# Patient Record
Sex: Male | Born: 1960 | Race: Black or African American | Hispanic: No | Marital: Married | Smoking: Never smoker
Health system: Southern US, Community
[De-identification: ages and names within clinical notes are randomized; demographics above are authoritative.]

## PROBLEM LIST (undated history)

## (undated) DIAGNOSIS — N4400 Torsion of testis, unspecified: Secondary | ICD-10-CM

## (undated) DIAGNOSIS — E785 Hyperlipidemia, unspecified: Secondary | ICD-10-CM

## (undated) HISTORY — PX: KNEE SURGERY: SHX244

## (undated) HISTORY — DX: Torsion of testis, unspecified: N44.00

## (undated) HISTORY — PX: OTHER SURGICAL HISTORY: SHX169

## (undated) HISTORY — DX: Hyperlipidemia, unspecified: E78.5

---

## 2005-06-16 ENCOUNTER — Emergency Department (HOSPITAL_COMMUNITY): Admission: EM | Admit: 2005-06-16 | Discharge: 2005-06-16 | Payer: Self-pay | Admitting: Emergency Medicine

## 2007-06-21 ENCOUNTER — Emergency Department (HOSPITAL_COMMUNITY): Admission: EM | Admit: 2007-06-21 | Discharge: 2007-06-21 | Payer: Self-pay | Admitting: *Deleted

## 2008-08-14 ENCOUNTER — Emergency Department (HOSPITAL_COMMUNITY): Admission: EM | Admit: 2008-08-14 | Discharge: 2008-08-14 | Payer: Self-pay | Admitting: Family Medicine

## 2008-11-01 ENCOUNTER — Encounter: Admission: RE | Admit: 2008-11-01 | Discharge: 2008-11-01 | Payer: Self-pay | Admitting: Family Medicine

## 2009-11-10 ENCOUNTER — Emergency Department (HOSPITAL_COMMUNITY): Admission: EM | Admit: 2009-11-10 | Discharge: 2009-11-10 | Payer: Self-pay | Admitting: Emergency Medicine

## 2010-04-12 LAB — HM COLONOSCOPY

## 2011-07-02 LAB — CBC
Hemoglobin: 12.8 — ABNORMAL LOW
Platelets: 218
RBC: 4.67

## 2011-07-02 LAB — POCT I-STAT, CHEM 8
BUN: 13
Calcium, Ion: 1.21
Hemoglobin: 14.3
Potassium: 4
Sodium: 142

## 2011-07-02 LAB — DIFFERENTIAL
Lymphocytes Relative: 35
Monocytes Absolute: 0.5
Monocytes Relative: 11
Neutro Abs: 2.1

## 2011-07-02 LAB — POCT CARDIAC MARKERS
CKMB, poc: 3.8
CKMB, poc: 4.9
Myoglobin, poc: 123
Troponin i, poc: <0.05

## 2014-01-28 ENCOUNTER — Emergency Department (HOSPITAL_COMMUNITY): Payer: 59

## 2014-01-28 ENCOUNTER — Encounter (HOSPITAL_COMMUNITY): Payer: Self-pay | Admitting: Emergency Medicine

## 2014-01-28 ENCOUNTER — Emergency Department (HOSPITAL_COMMUNITY)
Admission: EM | Admit: 2014-01-28 | Discharge: 2014-01-28 | Disposition: A | Discharge status: Home / Self Care | Payer: 59 | Attending: Emergency Medicine | Admitting: Emergency Medicine

## 2014-01-28 DIAGNOSIS — X500XXA Overexertion from strenuous movement or load, initial encounter: Secondary | ICD-10-CM | POA: Insufficient documentation

## 2014-01-28 DIAGNOSIS — Y9239 Other specified sports and athletic area as the place of occurrence of the external cause: Secondary | ICD-10-CM | POA: Insufficient documentation

## 2014-01-28 DIAGNOSIS — S82009A Unspecified fracture of unspecified patella, initial encounter for closed fracture: Secondary | ICD-10-CM

## 2014-01-28 DIAGNOSIS — Y92838 Other recreation area as the place of occurrence of the external cause: Secondary | ICD-10-CM

## 2014-01-28 DIAGNOSIS — Z79899 Other long term (current) drug therapy: Secondary | ICD-10-CM | POA: Insufficient documentation

## 2014-01-28 DIAGNOSIS — Y9367 Activity, basketball: Secondary | ICD-10-CM | POA: Insufficient documentation

## 2014-01-28 MED ORDER — OXYCODONE-ACETAMINOPHEN 5-325 MG PO TABS: 1.0000 | ORAL_TABLET | Freq: Four times a day (QID) | ORAL | Status: DC | PRN

## 2014-01-28 NOTE — Discharge Instructions (Signed)
Patellar Fracture, Adult A patellar fracture is a break in your kneecap (patella).  CAUSES   A direct blow to the knee or a fall is usually the cause of a broken patella.  A very hard and strong bending of your knee can cause a patellar fracture. RISK FACTORS Involvement in contact sports, especially sports that involve a lot of jumping. SIGNS AND SYMPTOMS   Tender and swollen knee.  Pain when you move your knee, especially when you try to straighten out your leg.  Difficulty walking or putting weight on your knee.  Misshapen knee (as if a bone is out of place). DIAGNOSIS  Patellar fracture is usually diagnosed with a physical exam and an X-ray exam. TREATMENT  Treatment depends on the type of fracture:  If your patella is still in the right position after the fracture and you can still straighten your leg out, you can usually be treated with a splint or cast for 4 6 weeks.  If your patella is broken into multiple small pieces but you are able to straighten your leg, you can usually be treated with a splint or cast for 4 6 weeks. Sometimes your patella may need to be removed before the cast is applied.  If you cannot straighten out your leg after a patellar fracture, then surgery is required to hold the bony fragments together until they heal. A cast or splint will be applied for 4 6 weeks. HOME CARE INSTRUCTIONS   Only take over-the-counter or prescription medicines for pain, discomfort, or fever as directed by your health care provider.  Use crutches as directed, and exercise the leg as directed.  Apply ice to the injured area:  Put ice in a plastic bag.  Place a towel between your skin and the bag.  Leave the ice on for 20 minutes, 2 3 times a day.  Elevate the affected knee above the level of your heart. SEEK MEDICAL CARE IF:  You suspect you have significantly injured your knee.  You hear a pop after a knee injury.  Your knee is misshapen after a knee  injury.  You have pain when you move your knee.  You have difficulty walking or putting weight on your knee.  You cannot fully move your knee. SEEK IMMEDIATE MEDICAL CARE IF:  You have redness, swelling, or increasing pain in your knee.  You have a fever. Document Released: 06/15/2003 Document Revised: 07/07/2013 Document Reviewed: 04/28/2013 [facility] Patient Information 2014 [place].

## 2014-01-28 NOTE — ED Notes (Addendum)
Per EMS patient dislocated patella after landing wrong playing basketball at the gym. Patient states as long as he keeps his leg straight he feels okay, when he bends it the patella moves up towards his thigh.

## 2014-01-28 NOTE — ED Provider Notes (Signed)
CSN: [id number]     Arrival date & time 01/28/14  1222 History   First MD Initiated Contact with Patient 01/28/14 1230     Chief Complaint  Patient presents with  . Knee Injury     (Consider location/radiation/quality/duration/timing/severity/associated sxs/prior Treatment) The history is provided by the patient.   patient was playing basketball he felt a pop in his right knee he states he gave out for. He's had trouble walking with it since. There is swelling of any. No other injury.  History reviewed. No pertinent past medical history. No past surgical history on file. No family history on file. History  Substance Use Topics  . Smoking status: Never Smoker   . Smokeless tobacco: Not on file  . Alcohol Use: Yes     Comment: socially    Review of Systems  Constitutional: Negative for activity change and appetite change.  Respiratory: Negative for chest tightness and shortness of breath.   Genitourinary: Negative for flank pain.  Musculoskeletal: Negative for back pain.       Right knee pain and swelling  Neurological: Negative for weakness and numbness.      Allergies  Review of patient's allergies indicates not on file.  Home Medications   Prior to Admission medications   Medication Sig Start Date End Date Taking? Authorizing Provider  Multiple Vitamin (MULTIVITAMIN WITH MINERALS) TABS tablet Take 1 tablet by mouth daily.   Yes Historical Provider, MD  oxyCODONE-acetaminophen (PERCOCET/ROXICET) 5-325 MG per tablet Take 1-2 tablets by mouth every 6 (six) hours as needed for severe pain. 01/28/14   Jasper Riling. Hodan Wurtz, MD   BP 131/77  Pulse 58  Temp(Src) 98 F (36.7 C) (Oral)  Resp 18  SpO2 98% Physical Exam  Constitutional: He is oriented to person, place, and time. He appears well-developed.  Cardiovascular: Normal rate and regular rhythm.   Musculoskeletal: He exhibits tenderness.  Swelling to right knee. Palpable deformity and diastases of right patella. Patient  is not able to extend right knee. Sensation and pulses intact over right foot. No tenderness over right hip. Skin is intact  Neurological: He is alert and oriented to person, place, and time.  Skin: Skin is warm.    ED Course  Procedures (including critical care time) Labs Review Labs Reviewed - No data to display  Imaging Review Dg Knee Complete 4 Views Right  01/28/2014   CLINICAL DATA:  Knee injury.  EXAM: RIGHT KNEE - COMPLETE 4+ VIEW  COMPARISON:  None.  FINDINGS: Fracture of the inferior lateral aspect of the patella is noted, age undetermined. Femur and tibia intact. No joint effusion.  IMPRESSION: 1. Fracture of the inferior lateral aspect of the patella, age undetermined. This fracture may be old.  2. No acute abnormality otherwise noted. No evidence of knee joint effusion.   Electronically Signed   By: Marcello Moores  Register   On: 01/28/2014 13:32     EKG Interpretation None      MDM   Final diagnoses:  Patella fracture    Patient with right inferior patella fracture. Immobilizer and will followup with orthopedic surgery. He is previously seen Raliegh Ip, and will followup with them    Jasper Riling. Alvino Chapel, [place] 01/28/14 [phone]

## 2014-01-28 NOTE — ED Notes (Signed)
Bed: WA06 Expected date:  Expected time:  Means of arrival:  Comments: EMS-knee dislocation

## 2014-11-06 ENCOUNTER — Ambulatory Visit (INDEPENDENT_AMBULATORY_CARE_PROVIDER_SITE_OTHER): Payer: 59 | Admitting: Family Medicine

## 2014-11-06 VITALS — BP 120/74 | HR 83 | Temp 98.9°F | Resp 20 | Ht 73.0 in | Wt 263.2 lb

## 2014-11-06 DIAGNOSIS — J0110 Acute frontal sinusitis, unspecified: Secondary | ICD-10-CM

## 2014-11-06 DIAGNOSIS — R112 Nausea with vomiting, unspecified: Secondary | ICD-10-CM

## 2014-11-06 DIAGNOSIS — R6889 Other general symptoms and signs: Secondary | ICD-10-CM

## 2014-11-06 DIAGNOSIS — L259 Unspecified contact dermatitis, unspecified cause: Secondary | ICD-10-CM

## 2014-11-06 LAB — POCT INFLUENZA A/B
Influenza A, POC: NEGATIVE
Influenza B, POC: NEGATIVE

## 2014-11-06 MED ORDER — OSELTAMIVIR PHOSPHATE 75 MG PO CAPS: 75.0000 mg | ORAL_CAPSULE | Freq: Two times a day (BID) | ORAL | Status: DC

## 2014-11-06 MED ORDER — TRIAMCINOLONE ACETONIDE 0.1 % EX CREA: 1.0000 "application " | TOPICAL_CREAM | Freq: Two times a day (BID) | CUTANEOUS | Status: DC

## 2014-11-06 NOTE — Progress Notes (Signed)
Chief Complaint:  Chief Complaint  Patient presents with  . Sinusitis  . Fatigue    HPI: Wesley King is a 54 y.o. male who is here for chills, fatigue and also sinus congestion;  went to work in this morning at [employer] at 2 am and started feeling like this, he has been working long hours 10-12 hours and 7 days a week and working out and he thinks that is why he is fatigued. He shows me his fitbit and he is drinking enough water . He has had the flu vaccine in Nov or December. Fatigue, chills. No fvers or sore throat, and he had some mucus stuck in throat. He has HAs but denies any facial pain, he states he feels congest for the last 1 week. He has msk aches but not sure if it is from working out.Denies fevers. He has a slight cough but not much, denies CP/SOB. He was supposed to work but "can't go no more" so came here tog et evaluated. No n/v/abd pain, increase thirst/urination. He has been drinking lots of orange juice  Ha sahd a rash on his left knee, had knee surgery in March and wound itself healed well. He had an area where it blistered not near the wound site and he put hydrocortisone cream on it and the area turned dark black, he also has a rash thatis itchy on the right side of his ar, that has been there for several months. No swelling, redness, warmth at those areas c/w skin infection. He states it is better but is wondering why his skin turned a darker color  History reviewed. No pertinent past medical history. Past Surgical History  Procedure Laterality Date  . Knee surgery     History   Social History  . Marital Status: Married    Spouse Name: N/A    Number of Children: N/A  . Years of Education: N/A   Social History Main Topics  . Smoking status: Never Smoker   . Smokeless tobacco: Never Used  . Alcohol Use: 0.0 oz/week    0 Not specified per week     King: socially  . Drug Use: No  . Sexual Activity: None   Other Topics Concern  . None   Social  History Narrative   History reviewed. No pertinent family history. No Known Allergies Prior to Admission medications   Medication Sig Start Date End Date Taking? Authorizing Provider  Multiple Vitamin (MULTIVITAMIN WITH MINERALS) TABS tablet Take 1 tablet by mouth daily.   Yes Historical Provider, MD  oxyCODONE-acetaminophen (PERCOCET/ROXICET) 5-325 MG per tablet Take 1-2 tablets by mouth every 6 (six) hours as needed for severe pain. Patient not taking: Reported on 11/06/2014 01/28/14   Wesley King. Pickering, MD     ROS: The patient denies  night sweats, unintentional weight loss, chest pain, palpitations, wheezing, dyspnea on exertion, nausea, vomiting, abdominal pain, dysuria, hematuria, melena, numbness, or tingling.  All other systems have been reviewed and were otherwise negative with the exception of those mentioned in the HPI and as above.    PHYSICAL EXAM: Filed Vitals:   11/06/14 1149  BP: 120/74  Pulse: 83  Temp: 98.9 F (37.2 C)  Resp: 20   Filed Vitals:   11/06/14 1149  Height: 6\' 1"  (1.854 m)  Weight: 263 lb 4 oz (119.409 kg)   Body mass index is 34.74 kg/(m^2).  General: Alert, no acute distress HEENT:  Normocephalic, atraumatic, oropharynx patent. EOMI, PERRLA  Erythematous throat, no exudates, TM normal, neg sinus tenderness, + erythematous/boggy nasal mucosa Cardiovascular:  Regular rate and rhythm, no rubs murmurs or gallops.  No Carotid bruits, radial pulse intact. No pedal edema.  Respiratory: Clear to auscultation bilaterally.  No wheezes, rales, or rhonchi.  No cyanosis, no use of accessory musculature GI: No organomegaly, abdomen is soft and non-tender, positive bowel sounds.  No masses. Skin: + eczematous rashes.+ dark areas, no infection Neurologic: Facial musculature symmetric. Psychiatric: Patient is appropriate throughout our interaction. Lymphatic: No cervical lymphadenopathy Musculoskeletal: Gait intact.   LABS: Results for orders placed or  performed in visit on 11/06/14  POCT Influenza A/B  Result Value Ref Range   Influenza A, POC Negative    Influenza B, POC Negative      EKG/XRAY:   Primary read interpreted by Dr. Marin King at [facility].   ASSESSMENT/PLAN: Encounter Diagnoses  Name Primary?  . Acute frontal sinusitis, recurrence not specified Yes  . Flu-like symptoms   . Non-intractable vomiting with nausea, vomiting of unspecified type   . Contact dermatitis    RX tamiflu D/w patient negative results but will treat since feeling msk aches and fatigue all over--unable to get blood,tried multiple times, patient also threw up while in office, mostly water and orange juice Will give z pack prn if worsening sinus sxs  Rx triamcinolone to affected area on skin, precautions given Push fluids, fu prn, work note given  Gross sideeffects, risk and benefits, and alternatives of medications d/w patient. Patient is aware that all medications have potential sideeffects and we are unable to predict every sideeffect or drug-drug interaction that may occur.  Wesley King, [place], DO 11/06/2014 1:39 PM

## 2014-11-06 NOTE — Patient Instructions (Signed)
Influenza °Influenza ("the flu") is a viral infection of the respiratory tract. It occurs more often in winter months because people spend more time in close contact with one another. Influenza can make you feel very sick. Influenza easily spreads from person to person (contagious). °CAUSES  °Influenza is caused by a virus that infects the respiratory tract. You can catch the virus by breathing in droplets from an infected person's cough or sneeze. You can also catch the virus by touching something that was recently contaminated with the virus and then touching your mouth, nose, or eyes. °RISKS AND COMPLICATIONS °You may be at risk for a more severe case of influenza if you smoke cigarettes, have diabetes, have chronic heart disease (such as heart failure) or lung disease (such as asthma), or if you have a weakened immune system. Elderly people and pregnant women are also at risk for more serious infections. The most common problem of influenza is a lung infection (pneumonia). Sometimes, this problem can require emergency medical care and may be life threatening. °SIGNS AND SYMPTOMS  °Symptoms typically last 4 to 10 days and may include: °· Fever. °· Chills. °· Headache, body aches, and muscle aches. °· Sore throat. °· Chest discomfort and cough. °· Poor appetite. °· Weakness or feeling tired. °· Dizziness. °· Nausea or vomiting. °DIAGNOSIS  °Diagnosis of influenza is often made based on your history and a physical exam. A nose or throat swab test can be done to confirm the diagnosis. °TREATMENT  °In mild cases, influenza goes away on its own. Treatment is directed at relieving symptoms. For more severe cases, your health care provider may prescribe antiviral medicines to shorten the sickness. Antibiotic medicines are not effective because the infection is caused by a virus, not by bacteria. °HOME CARE INSTRUCTIONS °· Take medicines only as directed by your health care provider. °· Use a cool mist humidifier to make  breathing easier. °· Get plenty of rest until your temperature returns to normal. This usually takes 3 to 4 days. °· Drink enough fluid to keep your urine clear or pale yellow. °· Cover your mouth and nose when coughing or sneezing, and wash your hands well to prevent the virus from spreading. °· Stay home from work or school until the fever is gone for at least 1 full day. °PREVENTION  °An annual influenza vaccination (flu shot) is the best way to avoid getting influenza. An annual flu shot is now routinely recommended for all adults in the [place] °SEEK MEDICAL CARE IF: °· You experience chest pain, your cough worsens, or you produce more mucus. °· You have nausea, vomiting, or diarrhea. °· Your fever returns or gets worse. °SEEK IMMEDIATE MEDICAL CARE IF: °· You have trouble breathing, you become short of breath, or your skin or nails become bluish. °· You have severe pain or stiffness in the neck. °· You develop a sudden headache, or pain in the face or ear. °· You have nausea or vomiting that you cannot control. °MAKE SURE YOU:  °· Understand these instructions. °· Will watch your condition. °· Will get help right away if you are not doing well or get worse. °Document Released: 09/13/2000 Document Revised: 01/31/2014 Document Reviewed: 12/16/2011 °Exit[employer]® Patient Information ©2015 [employer]. This information is not intended to replace advice given to you by your health care provider. Make sure you discuss any questions you have with your health care provider. ° °

## 2014-12-12 ENCOUNTER — Emergency Department (HOSPITAL_COMMUNITY): Payer: 59

## 2014-12-12 ENCOUNTER — Ambulatory Visit (INDEPENDENT_AMBULATORY_CARE_PROVIDER_SITE_OTHER): Payer: 59 | Admitting: Emergency Medicine

## 2014-12-12 ENCOUNTER — Encounter (HOSPITAL_COMMUNITY): Payer: Self-pay | Admitting: Emergency Medicine

## 2014-12-12 ENCOUNTER — Emergency Department (HOSPITAL_COMMUNITY)
Admission: EM | Admit: 2014-12-12 | Discharge: 2014-12-12 | Disposition: A | Discharge status: Home / Self Care | Payer: 59 | Attending: Emergency Medicine | Admitting: Emergency Medicine

## 2014-12-12 VITALS — BP 128/82 | HR 82 | Temp 98.0°F | Resp 18 | Ht 74.5 in | Wt 265.0 lb

## 2014-12-12 DIAGNOSIS — R079 Chest pain, unspecified: Secondary | ICD-10-CM

## 2014-12-12 DIAGNOSIS — R0789 Other chest pain: Secondary | ICD-10-CM | POA: Diagnosis not present

## 2014-12-12 DIAGNOSIS — Z7952 Long term (current) use of systemic steroids: Secondary | ICD-10-CM | POA: Diagnosis not present

## 2014-12-12 LAB — BASIC METABOLIC PANEL
ANION GAP: 8 (ref 5–15)
BUN: 8 mg/dL (ref 6–23)
CALCIUM: 9 mg/dL (ref 8.4–10.5)
CO2: 26 mmol/L (ref 19–32)
CREATININE: 1.01 mg/dL (ref 0.50–1.35)
Chloride: 104 mmol/L (ref 96–112)
GFR calc Af Amer: >90 mL/min (ref >90)
GFR calc non Af Amer: 83 mL/min — ABNORMAL LOW (ref >90)
GLUCOSE: 128 mg/dL — AB (ref 70–99)
Potassium: 3.8 mmol/L (ref 3.5–5.1)
SODIUM: 138 mmol/L (ref 135–145)

## 2014-12-12 LAB — I-STAT TROPONIN, ED
TROPONIN I, POC: 0 ng/mL (ref 0.00–0.08)
Troponin i, poc: 0 ng/mL (ref 0.00–0.08)

## 2014-12-12 LAB — CBC WITH DIFFERENTIAL/PLATELET
Basophils Absolute: 0 10*3/uL (ref 0.0–0.1)
Basophils Relative: 0 % (ref 0–1)
EOS PCT: 4 % (ref 0–5)
Eosinophils Absolute: 0.3 10*3/uL (ref 0.0–0.7)
HEMATOCRIT: 37.7 % — AB (ref 39.0–52.0)
Hemoglobin: 12.1 g/dL — ABNORMAL LOW (ref 13.0–17.0)
LYMPHS ABS: 1.6 10*3/uL (ref 0.7–4.0)
LYMPHS PCT: 21 % (ref 12–46)
MCH: 26.8 pg (ref 26.0–34.0)
MCHC: 32.1 g/dL (ref 30.0–36.0)
MCV: 83.4 fL (ref 78.0–100.0)
MONO ABS: 0.5 10*3/uL (ref 0.1–1.0)
MONOS PCT: 6 % (ref 3–12)
Neutro Abs: 5.5 10*3/uL (ref 1.7–7.7)
Neutrophils Relative %: 69 % (ref 43–77)
Platelets: 205 10*3/uL (ref 150–400)
RBC: 4.52 MIL/uL (ref 4.22–5.81)
RDW: 13.4 % (ref 11.5–15.5)
WBC: 7.9 10*3/uL (ref 4.0–10.5)

## 2014-12-12 MED ORDER — NITROGLYCERIN 0.3 MG SL SUBL: 0.4000 mg | SUBLINGUAL_TABLET | SUBLINGUAL | Status: DC | PRN

## 2014-12-12 MED ORDER — ASPIRIN 81 MG PO CHEW
324.0000 mg | CHEWABLE_TABLET | Freq: Once | ORAL | Status: AC
  Administered 2014-12-12: 324 mg via ORAL

## 2014-12-12 NOTE — ED Notes (Signed)
Pt started having chest pain today while work. Went to urgent care, received 2 nitro and 324 ASA with complete pain relief. Pt denies diaphoresis but does have some SOB. BP 127/85, HR 70

## 2014-12-12 NOTE — Patient Instructions (Signed)

## 2014-12-12 NOTE — Progress Notes (Signed)
[facility] [address] [place] [address] [phone]  Date:  12/12/2014   Name:  Wesley King   DOB:  07/14/1961   MRN:  [id number]  PCP:  No PCP Per Patient    Chief Complaint: Chest Pain and Shortness of Breath   History of Present Illness:  Wesley King is a 54 y.o. very pleasant male patient who presents with the following:  Patient who developed peristernal chest pressure at 0700.  Pain persists until now Non radiating. No nausea or vomiting. No diaphoresis No shortness of breath or wheezing.  No stool change No palpitations or rapid heart rate. Non smoker No meds Pain not affected by activity No improvement with over the counter medications or other home remedies. Denies other complaint or health concern today.   There are no active problems to display for this patient.   History reviewed. No pertinent past medical history.  Past Surgical History  Procedure Laterality Date  . Knee surgery      History  Substance Use Topics  . Smoking status: Never Smoker   . Smokeless tobacco: Never Used  . Alcohol Use: 0.0 oz/week    0 Standard drinks or equivalent per week     Comment: socially    History reviewed. No pertinent family history.  No Known Allergies  Medication list has been reviewed and updated.  Current Outpatient Prescriptions on File Prior to Visit  Medication Sig Dispense Refill  . Multiple Vitamin (MULTIVITAMIN WITH MINERALS) TABS tablet Take 1 tablet by mouth daily.    Marland Kitchen triamcinolone cream (KENALOG) 0.1 % Apply 1 application topically 2 (two) times daily. 30 g 0  . oseltamivir (TAMIFLU) 75 MG capsule Take 1 capsule (75 mg total) by mouth 2 (two) times daily. (Patient not taking: Reported on 12/12/2014) 10 capsule 0   No current facility-administered medications on file prior to visit.    Review of Systems:  As per HPI, otherwise negative.    Physical Examination: Filed Vitals:   12/12/14 1053   BP: 128/82  Pulse: 82  Temp: 98 F (36.7 C)  Resp: 18   Filed Vitals:   12/12/14 1053  Height: 6' 2.5" (1.892 m)  Weight: 265 lb (120.203 kg)   Body mass index is 33.58 kg/(m^2). Ideal Body Weight: Weight in (lb) to have BMI = 25: 196.9  GEN: WDWN, NAD, Non-toxic, A & O x 3 HEENT: Atraumatic, Normocephalic. Neck supple. No masses, No LAD. Ears and Nose: No external deformity. CV: RRR, No M/G/R. No JVD. No thrill. No extra heart sounds. PULM: CTA B, no wheezes, crackles, rhonchi. No retractions. No resp. distress. No accessory muscle use. ABD: S, NT, ND, +BS. No rebound. No HSM. EXTR: No c/c/e NEURO Normal gait.  PSYCH: Normally interactive. Conversant. Not depressed or anxious appearing.  Calm demeanor.    Assessment and Plan: Chest pain To ER via EMS IV O2 EKG  Signed,  Ellison Carwin, MD

## 2014-12-12 NOTE — Addendum Note (Signed)
Addended by: Dan Humphreys on: 12/12/2014 11:51 AM   Modules accepted: Orders

## 2014-12-12 NOTE — Discharge Instructions (Signed)
Take tylenol, motrin for pain.   Follow up with your doctor.  Return to ER if you have worse chest pain, shortness of breath.

## 2014-12-12 NOTE — ED Provider Notes (Signed)
CSN: [id number]     Arrival date & time 12/12/14  1224 History   First MD Initiated Contact with Patient 12/12/14 1227     Chief Complaint  Patient presents with  . Chest Pain     (Consider location/radiation/quality/duration/timing/severity/associated sxs/prior Treatment) The history is provided by the patient.  ORI TREJOS is a 54 y.o. male  Otherwise healthy here presenting with chest pain.  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  Around 7 AM he started having chest pain and  Shortness of breath.  Denies any radiation to the pain and not associated with diaphoresis.  Last about an hour or so and  went to urgent care.  He had 2 nitros and 325 mg aspirin and now is pain-free.  Denies any history of CAD and is not a smoker.  No family history of CAD.    History reviewed. No pertinent past medical history. Past Surgical History  Procedure Laterality Date  . Knee surgery     History reviewed. No pertinent family history. History  Substance Use Topics  . Smoking status: Never Smoker   . Smokeless tobacco: Never Used  . Alcohol Use: 0.0 oz/week    0 Standard drinks or equivalent per week     Comment: socially    Review of Systems  Cardiovascular: Positive for chest pain.  All other systems reviewed and are negative.     Allergies  Review of patient's allergies indicates no known allergies.  Home Medications   Prior to Admission medications   Medication Sig Start Date End Date Taking? Authorizing Provider  Multiple Vitamin (MULTIVITAMIN WITH MINERALS) TABS tablet Take 1 tablet by mouth daily.   Yes Historical Provider, MD  triamcinolone cream (KENALOG) 0.1 % Apply 1 application topically 2 (two) times daily. 11/06/14  Yes Thao P Le, DO  oseltamivir (TAMIFLU) 75 MG capsule Take 1 capsule (75 mg total) by mouth 2 (two) times daily. Patient not taking: Reported on 12/12/2014 11/06/14   Thao P Le, DO   BP 126/71 mmHg  Pulse 70  Temp(Src) 97.8 F (36.6  C)  Resp 20  Ht 6\' 1"  (1.854 m)  Wt 266 lb (120.657 kg)  BMI 35.10 kg/m2  SpO2 95% Physical Exam  Constitutional: He is oriented to person, place, and time. He appears well-developed and well-nourished.  HENT:  Head: Normocephalic.  Mouth/Throat: Oropharynx is clear and moist.  Eyes: Conjunctivae are normal. Pupils are equal, round, and reactive to light.  Neck: Normal range of motion. Neck supple.  Cardiovascular: Normal rate, regular rhythm and normal heart sounds.   Pulmonary/Chest: Effort normal and breath sounds normal. No respiratory distress. He has no wheezes. He has no rales.  Abdominal: Soft. Bowel sounds are normal. He exhibits no distension. There is no tenderness. There is no rebound.  Musculoskeletal: Normal range of motion. He exhibits no edema or tenderness.  Neurological: He is alert and oriented to person, place, and time. No cranial nerve deficit. Coordination normal.  Skin: Skin is warm and dry.  Psychiatric: He has a normal mood and affect. His behavior is normal. Judgment and thought content normal.  Nursing note and vitals reviewed.   ED Course  Procedures (including critical care time) Labs Review Labs Reviewed  CBC WITH DIFFERENTIAL/PLATELET - Abnormal; Notable for the following:    Hemoglobin 12.1 (*)    HCT 37.7 (*)    All other components within normal limits  BASIC METABOLIC PANEL - Abnormal; Notable for  the following:    Glucose, Bld 128 (*)    GFR calc non Af Amer 83 (*)    All other components within normal limits  I-STAT TROPOININ, ED  Randolm Idol, ED    Imaging Review Dg Chest 2 View  12/12/2014   CLINICAL DATA:  Shortness of breath and left chest tightness and 7 a.m.  EXAM: CHEST  2 VIEW  COMPARISON:  08/14/2008  FINDINGS: Normal heart size and mediastinal contours. No acute infiltrate or edema. No effusion or pneumothorax. No acute osseous findings.  IMPRESSION: No active cardiopulmonary disease.   Electronically Signed   By: Monte Fantasia M.D.   On: 12/12/2014 13:37     EKG Interpretation   Date/Time:  Monday December 12 2014 12:33:32 EDT Ventricular Rate:  61 PR Interval:  181 QRS Duration: 90 QT Interval:  428 QTC Calculation: 431 R Axis:   65 Text Interpretation:  Sinus rhythm No significant change since last  tracing Confirmed by Yessika Otte  MD, Corrion Stirewalt ([id number]) on 12/12/2014 12:36:24 PM      MDM   Final diagnoses:  None   ASAHEL RISDEN is a 54 y.o. male here with chest pain that resolved. Will need to r/o ACS. I doubt PE or dissection. Will get delta trop and reassess.   4:09 PM Delta trop neg. Pain free. Will dc home.     Wandra Arthurs, MD 12/12/14 [phone]

## 2016-05-20 IMAGING — CR DG CHEST 2V
2 series · 2 of 2 positions shown · non-contrast
Comparison: 08/14/2008

CLINICAL DATA: Shortness of breath and left chest tightness and 7
a.m..

EXAM:
CHEST  2 VIEW

[chest pa]
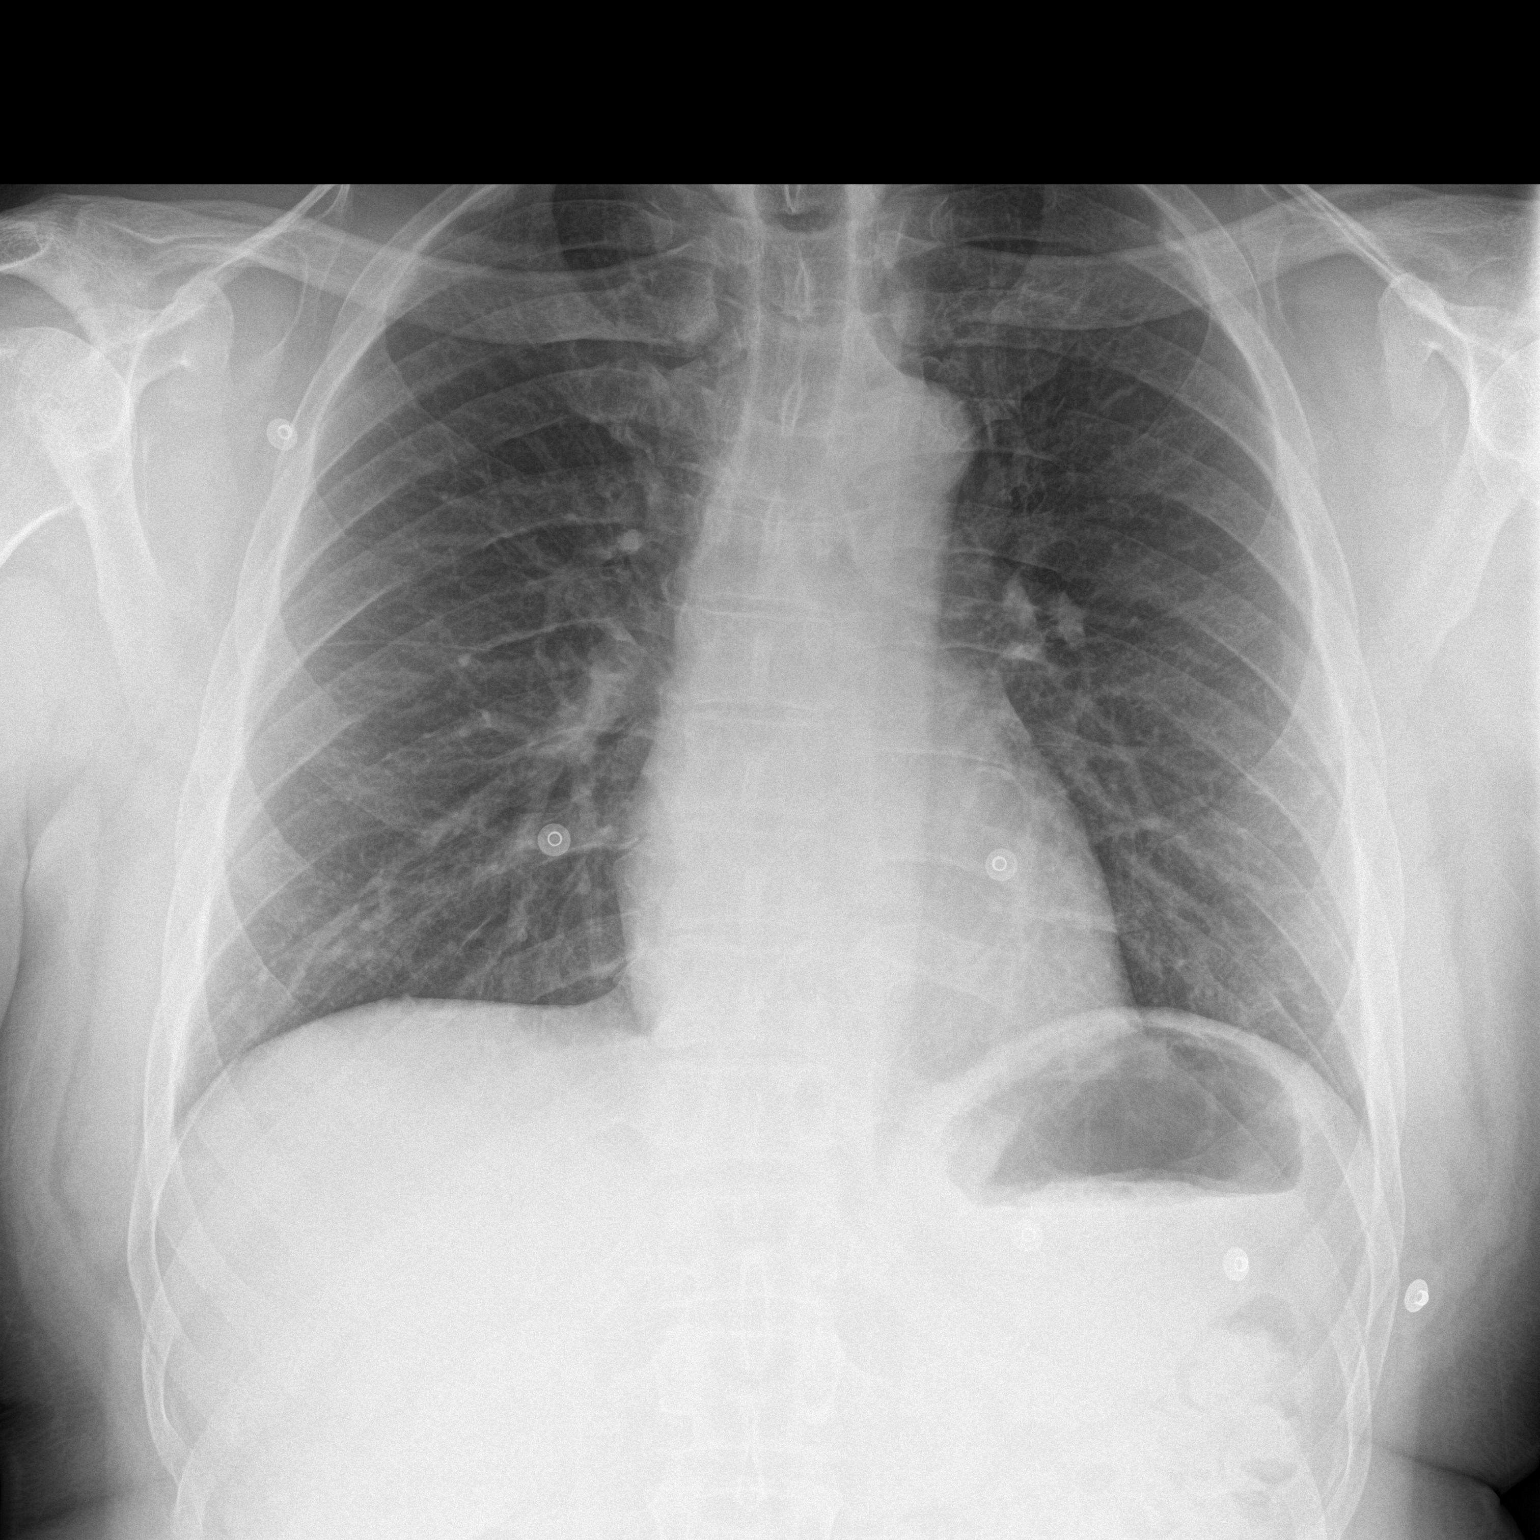

[chest lat]
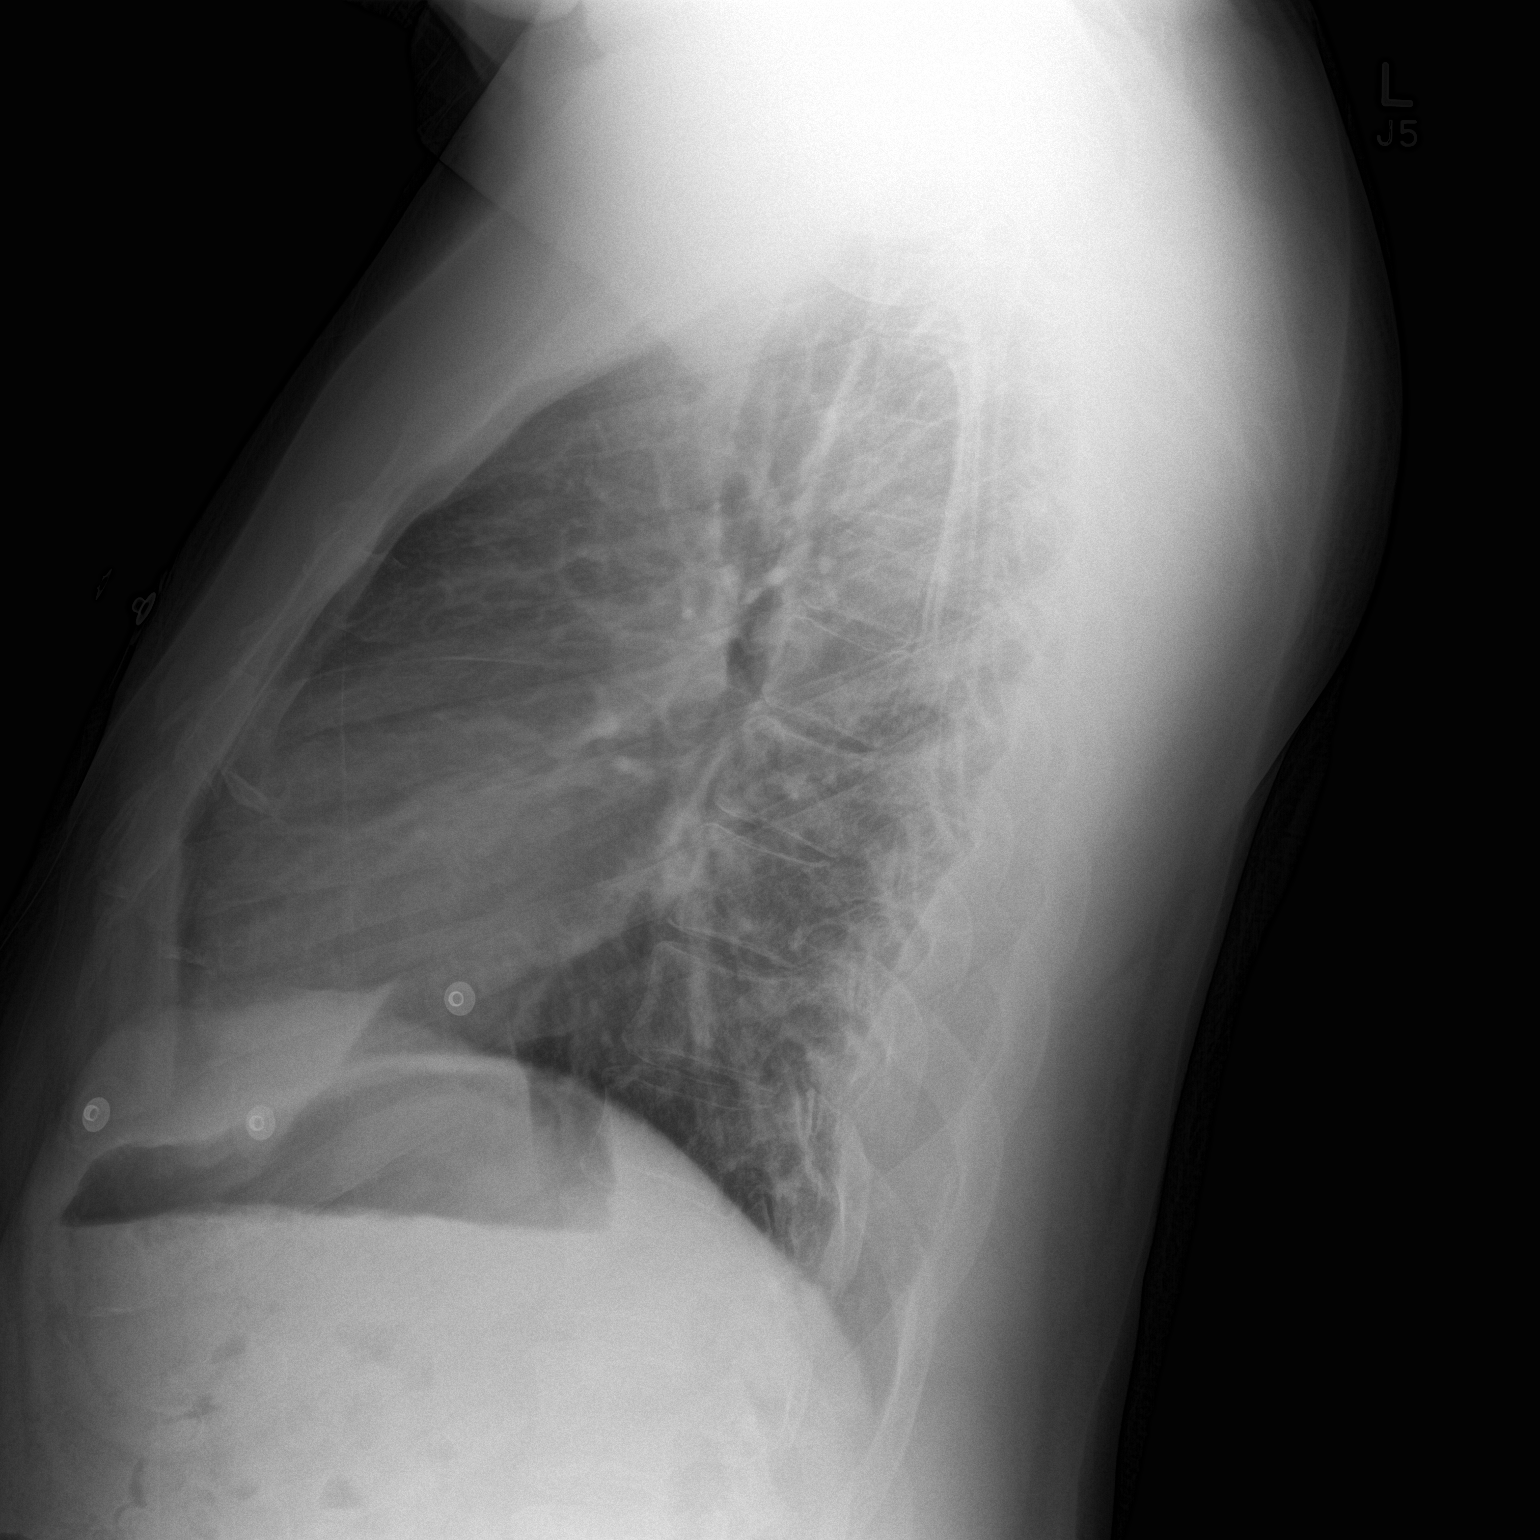

[2 of 2 positions shown; findings below may reference images not displayed]

FINDINGS: Normal heart size and mediastinal contours. No acute infiltrate or
edema. No effusion or pneumothorax. No acute osseous findings.
IMPRESSION: No active cardiopulmonary disease.

## 2016-10-18 DIAGNOSIS — M659 Synovitis and tenosynovitis, unspecified: Secondary | ICD-10-CM | POA: Diagnosis not present

## 2016-12-06 DIAGNOSIS — E7800 Pure hypercholesterolemia, unspecified: Secondary | ICD-10-CM | POA: Diagnosis not present

## 2016-12-06 DIAGNOSIS — R61 Generalized hyperhidrosis: Secondary | ICD-10-CM | POA: Diagnosis not present

## 2016-12-06 DIAGNOSIS — Z125 Encounter for screening for malignant neoplasm of prostate: Secondary | ICD-10-CM | POA: Diagnosis not present

## 2016-12-06 DIAGNOSIS — Z0000 Encounter for general adult medical examination without abnormal findings: Secondary | ICD-10-CM | POA: Diagnosis not present

## 2017-11-13 ENCOUNTER — Other Ambulatory Visit: Payer: Self-pay | Admitting: Podiatry

## 2017-11-13 ENCOUNTER — Ambulatory Visit: Payer: Self-pay | Admitting: Podiatry

## 2017-11-13 ENCOUNTER — Encounter: Payer: Self-pay | Admitting: Podiatry

## 2017-11-13 ENCOUNTER — Ambulatory Visit (INDEPENDENT_AMBULATORY_CARE_PROVIDER_SITE_OTHER): Payer: Federal, State, Local not specified - PPO

## 2017-11-13 VITALS — BP 123/72 | HR 64 | Resp 16

## 2017-11-13 DIAGNOSIS — M2142 Flat foot [pes planus] (acquired), left foot: Secondary | ICD-10-CM | POA: Diagnosis not present

## 2017-11-13 DIAGNOSIS — M79672 Pain in left foot: Secondary | ICD-10-CM

## 2017-11-13 DIAGNOSIS — M2141 Flat foot [pes planus] (acquired), right foot: Secondary | ICD-10-CM | POA: Diagnosis not present

## 2017-11-13 DIAGNOSIS — M722 Plantar fascial fibromatosis: Secondary | ICD-10-CM | POA: Diagnosis not present

## 2017-11-13 DIAGNOSIS — M79671 Pain in right foot: Secondary | ICD-10-CM | POA: Diagnosis not present

## 2017-11-13 MED ORDER — DICLOFENAC SODIUM 75 MG PO TBEC: 75.0000 mg | DELAYED_RELEASE_TABLET | Freq: Two times a day (BID) | ORAL | 2 refills | Status: DC

## 2017-11-13 NOTE — Patient Instructions (Signed)

## 2017-11-13 NOTE — Progress Notes (Signed)
   Subjective:    Patient ID: Wesley King, male    DOB: Jan 19, 1961, 57 y.o.   MRN: [id number]  HPI    Review of Systems  All other systems reviewed and are negative.      Objective:   Physical Exam        Assessment & Plan:

## 2017-11-14 ENCOUNTER — Telehealth: Payer: Self-pay | Admitting: Podiatry

## 2017-11-14 NOTE — Progress Notes (Signed)
Okay systems is subjective:   Patient ID: Wesley King, male   DOB: 57 y.o.   MRN: [id number]   HPI Patient presents stating that his feet get real tired when he works and he develops chronic discomfort in his arch and he works on [employer].  Patient does not smoke and likes to be active   Review of Systems  All other systems reviewed and are negative.       Objective:  Physical Exam  Constitutional: He appears well-developed and well-nourished.  Cardiovascular: Intact distal pulses.  Pulmonary/Chest: Effort normal.  Musculoskeletal: Normal range of motion.  Neurological: He is alert.  Skin: Skin is warm.  Nursing note and vitals reviewed.   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  Patient was noted to have good digital perfusion and is well oriented x3     Assessment:  Inflammatory tendinitis with plantar flexion deformity noted and discomfort associated with floor type and surgical structure of the feet     Plan:  H&P x-rays reviewed condition discussed.  At this point I recommended orthotics for lifting the arch and explained the utilization of orthotics and patient is scanned for customized orthotic devices.  He will be seen back when those are returned we also may place him on anti-inflammatories  X-ray indicates moderate depression of the arch with spur formation and no indication of stress fracture arthritis

## 2017-11-14 NOTE — Telephone Encounter (Signed)
Called pt after calling insurance company([employer]) and plan has expired. Pt states he now has bcbs and has not gotten card yet. I told him I would hold on to the foam box until he calls me with insurance info so I can verify benefits.

## 2017-11-21 ENCOUNTER — Telehealth: Payer: Self-pay | Admitting: Podiatry

## 2017-11-21 NOTE — Telephone Encounter (Signed)
Left message for pt that when I called his insurance company they said we are out of network for this pt. Orthotics would not be covered.And to call me back Monday to discuss.

## 2017-11-26 NOTE — Telephone Encounter (Signed)
Pt returned call and is going to call insurance company to confirm they are out of network and is aware of the cost.

## 2017-12-08 DIAGNOSIS — Z125 Encounter for screening for malignant neoplasm of prostate: Secondary | ICD-10-CM | POA: Diagnosis not present

## 2017-12-08 DIAGNOSIS — E7800 Pure hypercholesterolemia, unspecified: Secondary | ICD-10-CM | POA: Diagnosis not present

## 2017-12-08 DIAGNOSIS — Z0000 Encounter for general adult medical examination without abnormal findings: Secondary | ICD-10-CM | POA: Diagnosis not present

## 2017-12-08 DIAGNOSIS — Z1159 Encounter for screening for other viral diseases: Secondary | ICD-10-CM | POA: Diagnosis not present

## 2018-01-02 DIAGNOSIS — J309 Allergic rhinitis, unspecified: Secondary | ICD-10-CM | POA: Diagnosis not present

## 2018-06-02 NOTE — Progress Notes (Signed)
Subjective:    Patient ID: Wesley King, male    DOB: November 03, 1960, 57 y.o.   MRN: [id number]  HPI:  Wesley King is here to establish as a new pt.  He is a pleasant 57 year old male. PMH: Obesity and elevated LDL- 137 on 12/08/17 He only takes OTC supplements and has a rigorous exercise regime- biking, golfing, swimming, walking, and weight lifting. He has been following a "clean diet" the last 3 weeks and has lost 4-6 lbs, current wt 263. His goal wt is 245 He denies tobacco use and seldom drinks ETOH He denies current pain, however has hx of intermittent back pain since 2005.  Pain r/t to repetitive use while at work at [employer] (ie. Heavy lifting, bending/pushing/pulling, hours of standing on concrete floor), denies acute injury/trauma prior to onset of back pain. He is treated by local Orthopedic Specialist and has FMLA paperwork that needs to be completed, advised to ask treating provider to accomplish form.   Patient Care Team    Relationship Specialty Notifications Start End  Esaw Grandchild, NP PCP - General Family Medicine  06/05/18     Patient Active Problem List   Diagnosis Date Noted  . Healthcare maintenance 06/05/2018  . Elevated LDL cholesterol level 06/05/2018     Past Medical History:  Diagnosis Date  . Testicular torsion      Past Surgical History:  Procedure Laterality Date  . KNEE SURGERY    . testicular torsion repair       Family History  Problem Relation Age of Onset  . Diabetes Father   . Healthy Sister   . Diabetes Brother   . Healthy Son   . Healthy Brother   . Healthy Son      Social History   Substance and Sexual Activity  Drug Use No     Social History   Substance and Sexual Activity  Alcohol Use Yes  . Alcohol/week: 1.0 standard drinks  . Types: 1 Cans of beer per week     Social History   Tobacco Use  Smoking Status Never Smoker  Smokeless Tobacco Never Used     Outpatient Encounter Medications as of 06/05/2018   Medication Sig  . Cholecalciferol (VITAMIN D PO) Take 1 tablet by mouth daily.  . Multiple Vitamin (MULTIVITAMIN WITH MINERALS) TABS tablet Take 1 tablet by mouth daily.  . Omega-3 Fatty Acids (FISH OIL PO) Take 1 tablet by mouth daily.  . [DISCONTINUED] diclofenac (VOLTAREN) 75 MG EC tablet Take 1 tablet (75 mg total) by mouth 2 (two) times daily.  . [DISCONTINUED] oseltamivir (TAMIFLU) 75 MG capsule Take 1 capsule (75 mg total) by mouth 2 (two) times daily.  . [DISCONTINUED] triamcinolone cream (KENALOG) 0.1 % Apply 1 application topically 2 (two) times daily.   Facility-Administered Encounter Medications as of 06/05/2018  Medication  . nitroGLYCERIN (NITROSTAT) SL tablet 0.3 mg    Allergies: Patient has no known allergies.  Body mass index is 34.35 kg/m.  Blood pressure 119/71, pulse (!) 57, height 6' 1.5" (1.867 m), weight 263 lb 14.4 oz (119.7 kg), SpO2 98 %.  Review of Systems  Constitutional: Positive for fatigue. Negative for activity change, appetite change, chills, diaphoresis, fever and unexpected weight change.  HENT: Negative for congestion.   Eyes: Negative for visual disturbance.  Respiratory: Negative for cough, chest tightness, shortness of breath, wheezing and stridor.   Cardiovascular: Negative for chest pain, palpitations and leg swelling.  Gastrointestinal: Negative for abdominal distention, abdominal  pain, blood in stool, constipation, diarrhea, nausea and vomiting.  Endocrine: Negative for cold intolerance, heat intolerance, polydipsia, polyphagia and polyuria.  Genitourinary: Negative for difficulty urinating, flank pain and hematuria.  Musculoskeletal: Positive for arthralgias. Negative for back pain, gait problem, joint swelling, myalgias, neck pain and neck stiffness.  Skin: Negative for color change, pallor, rash and wound.  Neurological: Negative for dizziness and headaches.  Hematological: Does not bruise/bleed easily.  Psychiatric/Behavioral: Positive  for sleep disturbance. Negative for behavioral problems, confusion, decreased concentration, dysphoric mood, hallucinations, self-injury and suicidal ideas. The patient is not nervous/anxious and is not hyperactive.        Objective:   Physical Exam  Constitutional: He is oriented to person, place, and time. He appears well-developed and well-nourished. No distress.  HENT:  Head: Normocephalic and atraumatic.  Right Ear: External ear normal.  Left Ear: External ear normal.  Nose: Nose normal.  Mouth/Throat: Oropharynx is clear and moist.  Cardiovascular: Normal rate, regular rhythm, normal heart sounds and intact distal pulses.  No murmur heard. Pulmonary/Chest: Effort normal and breath sounds normal. No stridor. No respiratory distress. He has no wheezes. He has no rales. He exhibits no tenderness.  Neurological: He is alert and oriented to person, place, and time.  Skin: Skin is warm and dry. Capillary refill takes less than 2 seconds. No rash noted. He is not diaphoretic. No erythema. No pallor.  Psychiatric: He has a normal mood and affect. His behavior is normal. Judgment and thought content normal.  Nursing note and vitals reviewed.     Assessment & Plan:   1. Healthcare maintenance   2. Family history of diabetes mellitus in father   3. Family history of diabetes mellitus in brother   80. Elevated LDL cholesterol level     Healthcare maintenance Continue your regular exercise and healthy eating. Increase water intake, you should strive for half of your body weight in ounces, so about 125 oz/day. We will call you when your lab results are available. Please schedule a complete physical in 4 weeks. Please request your current Orthopedic provider to complete your FMLA paperwork for lower back pain.  Elevated LDL cholesterol level 12/08/17 LDL 137 He has sig reduced saturated fat and continues to exercise regularly with adequate cardio     FOLLOW-UP:  Return in about 4  weeks (around 07/03/2018) for CPE.

## 2018-06-05 ENCOUNTER — Encounter: Payer: Self-pay | Admitting: Adult Health

## 2018-06-05 ENCOUNTER — Ambulatory Visit (INDEPENDENT_AMBULATORY_CARE_PROVIDER_SITE_OTHER): Payer: Federal, State, Local not specified - PPO | Admitting: Adult Health

## 2018-06-05 VITALS — BP 119/71 | HR 57 | Ht 73.5 in | Wt 263.9 lb

## 2018-06-05 DIAGNOSIS — Z833 Family history of diabetes mellitus: Secondary | ICD-10-CM | POA: Diagnosis not present

## 2018-06-05 DIAGNOSIS — Z0000 Encounter for general adult medical examination without abnormal findings: Secondary | ICD-10-CM | POA: Diagnosis not present

## 2018-06-05 DIAGNOSIS — E7800 Pure hypercholesterolemia, unspecified: Secondary | ICD-10-CM | POA: Diagnosis not present

## 2018-06-05 NOTE — Assessment & Plan Note (Signed)
12/08/17 LDL 137 He has sig reduced saturated fat and continues to exercise regularly with adequate cardio

## 2018-06-05 NOTE — Patient Instructions (Signed)
Mediterranean Diet A Mediterranean diet refers to food and lifestyle choices that are based on the traditions of countries located on the [place]. This way of eating has been shown to help prevent certain conditions and improve outcomes for people who have chronic diseases, like kidney disease and heart disease. What are tips for following this plan? Lifestyle  Cook and eat meals together with your family, when possible.  Drink enough fluid to keep your urine clear or pale yellow.  Be physically active every day. This includes: ? Aerobic exercise like running or swimming. ? Leisure activities like gardening, walking, or housework.  Get 7-8 hours of sleep each night.  If recommended by your health care provider, drink red wine in moderation. This means 1 glass a day for nonpregnant women and 2 glasses a day for men. A glass of wine equals 5 oz (150 mL). Reading food labels  Check the serving size of packaged foods. For foods such as rice and pasta, the serving size refers to the amount of cooked product, not dry.  Check the total fat in packaged foods. Avoid foods that have saturated fat or trans fats.  Check the ingredients list for added sugars, such as corn syrup. Shopping  At the grocery store, buy most of your food from the areas near the walls of the store. This includes: ? Fresh fruits and vegetables (produce). ? Grains, beans, nuts, and seeds. Some of these may be available in unpackaged forms or large amounts (in bulk). ? Fresh seafood. ? Poultry and eggs. ? Low-fat dairy products.  Buy whole ingredients instead of prepackaged foods.  Buy fresh fruits and vegetables in-season from local farmers markets.  Buy frozen fruits and vegetables in resealable bags.  If you do not have access to quality fresh seafood, buy precooked frozen shrimp or canned fish, such as tuna, salmon, or sardines.  Buy small amounts of raw or cooked vegetables, salads, or olives from the  deli or salad bar at your store.  Stock your pantry so you always have certain foods on hand, such as olive oil, canned tuna, canned tomatoes, rice, pasta, and beans. Cooking  Cook foods with extra-virgin olive oil instead of using butter or other vegetable oils.  Have meat as a side dish, and have vegetables or grains as your main dish. This means having meat in small portions or adding small amounts of meat to foods like pasta or stew.  Use beans or vegetables instead of meat in common dishes like chili or lasagna.  Experiment with different cooking methods. Try roasting or broiling vegetables instead of steaming or sauteing them.  Add frozen vegetables to soups, stews, pasta, or rice.  Add nuts or seeds for added healthy fat at each meal. You can add these to yogurt, salads, or vegetable dishes.  Marinate fish or vegetables using olive oil, lemon juice, garlic, and fresh herbs. Meal planning  Plan to eat 1 vegetarian meal one day each week. Try to work up to 2 vegetarian meals, if possible.  Eat seafood 2 or more times a week.  Have healthy snacks readily available, such as: ? Vegetable sticks with hummus. ? Greek yogurt. ? Fruit and nut trail mix.  Eat balanced meals throughout the week. This includes: ? Fruit: 2-3 servings a day ? Vegetables: 4-5 servings a day ? Low-fat dairy: 2 servings a day ? Fish, poultry, or lean meat: 1 serving a day ? Beans and legumes: 2 or more servings a week ? Nuts   and seeds: 1-2 servings a day ? Whole grains: 6-8 servings a day ? Extra-virgin olive oil: 3-4 servings a day  Limit red meat and sweets to only a few servings a month What are my food choices?  Mediterranean diet ? Recommended ? Grains: Whole-grain pasta. Brown rice. Bulgar wheat. Polenta. Couscous. Whole-wheat bread. [place]. ? Vegetables: Artichokes. Beets. Broccoli. Cabbage. Carrots. Eggplant. Green beans. Chard. Kale. Spinach. Onions. Leeks. Peas. Squash.  Tomatoes. Peppers. Radishes. ? Fruits: Apples. Apricots. Avocado. Berries. Bananas. Cherries. Dates. Figs. Grapes. Lemons. Melon. Oranges. Peaches. Plums. Pomegranate. ? Meats and other protein foods: Beans. Almonds. Sunflower seeds. Pine nuts. Peanuts. [place]. Salmon. Scallops. Shrimp. [place]. Tilapia. Clams. Oysters. Eggs. ? Dairy: Low-fat milk. Cheese. Greek yogurt. ? Beverages: Water. Red wine. Herbal tea. ? Fats and oils: Extra virgin olive oil. Avocado oil. Grape seed oil. ? Sweets and desserts: [place] yogurt with honey. Baked apples. Poached pears. Trail mix. ? Seasoning and other foods: Basil. Cilantro. Coriander. Cumin. Mint. Parsley. Sage. Rosemary. Tarragon. Garlic. Oregano. Thyme. Pepper. Balsalmic vinegar. Tahini. Hummus. Tomato sauce. Olives. Mushrooms. ? Limit these ? Grains: Prepackaged pasta or rice dishes. Prepackaged cereal with added sugar. ? Vegetables: Deep fried potatoes (french fries). ? Fruits: Fruit canned in syrup. ? Meats and other protein foods: Beef. Pork. Lamb. Poultry with skin. Hot dogs. [place] Salines. ? Dairy: Ice cream. Sour cream. Whole milk. ? Beverages: Juice. Sugar-sweetened soft drinks. Beer. Liquor and spirits. ? Fats and oils: Butter. Canola oil. Vegetable oil. Beef fat (tallow). Lard. ? Sweets and desserts: Cookies. Cakes. Pies. Candy. ? Seasoning and other foods: Mayonnaise. Premade sauces and marinades. ? The items listed may not be a complete list. Talk with your dietitian about what dietary choices are right for you. Summary  The Mediterranean diet includes both food and lifestyle choices.  Eat a variety of fresh fruits and vegetables, beans, nuts, seeds, and whole grains.  Limit the amount of red meat and sweets that you eat.  Talk with your health care provider about whether it is safe for you to drink red wine in moderation. This means 1 glass a day for nonpregnant women and 2 glasses a day for men. A glass of wine equals 5 oz (150 mL). This information  is not intended to replace advice given to you by your health care provider. Make sure you discuss any questions you have with your health care provider. Document Released: 05/09/2016 Document Revised: 06/11/2016 Document Reviewed: 05/09/2016 Elsevier Interactive Patient Education  2018 [place] your regular exercise and healthy eating. Increase water intake, you should strive for half of your body weight in ounces, so about 125 oz/day. We will call you when your lab results are available. Please schedule a complete physical in 4 weeks. Please request your current Orthopedic provider to complete your FMLA paperwork for lower back pain. WELCOME TO THE PRACTICE!

## 2018-06-05 NOTE — Assessment & Plan Note (Signed)
Continue your regular exercise and healthy eating. Increase water intake, you should strive for half of your body weight in ounces, so about 125 oz/day. We will call you when your lab results are available. Please schedule a complete physical in 4 weeks. Please request your current Orthopedic provider to complete your FMLA paperwork for lower back pain.

## 2018-06-06 LAB — CBC WITH DIFFERENTIAL/PLATELET
BASOS ABS: 0.1 10*3/uL (ref 0.0–0.2)
Basos: 2 %
EOS (ABSOLUTE): 0.3 10*3/uL (ref 0.0–0.4)
EOS: 6 %
HEMATOCRIT: 38 % (ref 37.5–51.0)
HEMOGLOBIN: 12.2 g/dL — AB (ref 13.0–17.7)
IMMATURE GRANS (ABS): 0 10*3/uL (ref 0.0–0.1)
Immature Granulocytes: 0 %
LYMPHS: 32 %
Lymphocytes Absolute: 1.5 10*3/uL (ref 0.7–3.1)
MCH: 26.5 pg — AB (ref 26.6–33.0)
MCHC: 32.1 g/dL (ref 31.5–35.7)
MCV: 82 fL (ref 79–97)
MONOCYTES: 9 %
Monocytes Absolute: 0.4 10*3/uL (ref 0.1–0.9)
NEUTROS ABS: 2.3 10*3/uL (ref 1.4–7.0)
Neutrophils: 51 %
Platelets: 221 10*3/uL (ref 150–450)
RBC: 4.61 x10E6/uL (ref 4.14–5.80)
RDW: 12.6 % (ref 12.3–15.4)
WBC: 4.6 10*3/uL (ref 3.4–10.8)

## 2018-06-06 LAB — LIPID PANEL
CHOL/HDL RATIO: 3.9 ratio (ref 0.0–5.0)
Cholesterol, Total: 175 mg/dL (ref 100–199)
HDL: 45 mg/dL (ref >39)
LDL CALC: 115 mg/dL — AB (ref 0–99)
Triglycerides: 76 mg/dL (ref 0–149)
VLDL CHOLESTEROL CAL: 15 mg/dL (ref 5–40)

## 2018-06-06 LAB — COMPREHENSIVE METABOLIC PANEL
A/G RATIO: 1.3 (ref 1.2–2.2)
ALK PHOS: 76 IU/L (ref 39–117)
ALT: 22 IU/L (ref 0–44)
AST: 27 IU/L (ref 0–40)
Albumin: 4 g/dL (ref 3.5–5.5)
BILIRUBIN TOTAL: 0.3 mg/dL (ref 0.0–1.2)
BUN/Creatinine Ratio: 17 (ref 9–20)
BUN: 18 mg/dL (ref 6–24)
CO2: 22 mmol/L (ref 20–29)
Calcium: 9.2 mg/dL (ref 8.7–10.2)
Chloride: 106 mmol/L (ref 96–106)
Creatinine, Ser: 1.07 mg/dL (ref 0.76–1.27)
GFR calc Af Amer: 89 mL/min/{1.73_m2} (ref >59)
GFR calc non Af Amer: 77 mL/min/{1.73_m2} (ref >59)
GLOBULIN, TOTAL: 3.2 g/dL (ref 1.5–4.5)
Glucose: 112 mg/dL — ABNORMAL HIGH (ref 65–99)
POTASSIUM: 4.8 mmol/L (ref 3.5–5.2)
SODIUM: 143 mmol/L (ref 134–144)
Total Protein: 7.2 g/dL (ref 6.0–8.5)

## 2018-06-06 LAB — TSH: TSH: 0.764 u[IU]/mL (ref 0.450–4.500)

## 2018-06-06 LAB — HEMOGLOBIN A1C
Est. average glucose Bld gHb Est-mCnc: 103 mg/dL
Hgb A1c MFr Bld: 5.2 % (ref 4.8–5.6)

## 2018-06-08 ENCOUNTER — Encounter: Payer: Self-pay | Admitting: Adult Health

## 2018-07-06 ENCOUNTER — Ambulatory Visit (INDEPENDENT_AMBULATORY_CARE_PROVIDER_SITE_OTHER): Payer: Federal, State, Local not specified - PPO | Admitting: Adult Health

## 2018-07-06 ENCOUNTER — Encounter: Payer: Self-pay | Admitting: Adult Health

## 2018-07-06 VITALS — BP 121/64 | HR 56 | Ht 73.5 in | Wt 268.3 lb

## 2018-07-06 DIAGNOSIS — Z23 Encounter for immunization: Secondary | ICD-10-CM | POA: Diagnosis not present

## 2018-07-06 DIAGNOSIS — H9193 Unspecified hearing loss, bilateral: Secondary | ICD-10-CM

## 2018-07-06 DIAGNOSIS — Z0000 Encounter for general adult medical examination without abnormal findings: Secondary | ICD-10-CM | POA: Diagnosis not present

## 2018-07-06 DIAGNOSIS — E7800 Pure hypercholesterolemia, unspecified: Secondary | ICD-10-CM

## 2018-07-06 NOTE — Assessment & Plan Note (Signed)
Increase water intake and follow Mediterranean Diet. Continue your excellent level of activity! Overall you labs looks good, just reduce saturated fat to improve LDL cholesterol. Referral to Audiology placed, re: evaluate hearing.  Recommend annual physical with fasting labs.

## 2018-07-06 NOTE — Progress Notes (Signed)
Subjective:    Patient ID: Wesley King, male    DOB: 1961/03/01, 57 y.o.   MRN: [id number]  HPI:  06/05/18 OV: Wesley King is here to establish as a new pt.  He is a pleasant 57 year old male. PMH: Obesity and elevated LDL- 137 on 12/08/17 He only takes OTC supplements and has a rigorous exercise regime- biking, golfing, swimming, walking, and weight lifting. He has been following a "clean diet" the last 3 weeks and has lost 4-6 lbs, current wt 263. His goal wt is 245 He denies tobacco use and seldom drinks ETOH He denies current pain, however has hx of intermittent back pain since 2005.  Pain r/t to repetitive use while at work at [employer] (ie. Heavy lifting, bending/pushing/pulling, hours of standing on concrete floor), denies acute injury/trauma prior to onset of back pain. He is treated by local Orthopedic Specialist and has FMLA paperwork that needs to be completed, advised to ask treating provider to accomplish form.  07/06/18 OV: Wesley King presents for CPE He continues to remains extremely active and has ben trying to improve his eating habits. He denies acute complaints/issues today  Healthcare Maintenance: Colonoscopy-UTD, next due 2021 Immunizations-Tdap and Flu updated today LDCT-N/A AAA Screening-N/A  Patient Care Team    Relationship Specialty Notifications Start End  Esaw Grandchild, NP PCP - General Family Medicine  06/05/18   Gastroenterology, [facility]    06/08/18     Patient Active Problem List   Diagnosis Date Noted  . Bilateral hearing loss 07/06/2018  . Healthcare maintenance 06/05/2018  . Elevated LDL cholesterol level 06/05/2018     Past Medical History:  Diagnosis Date  . Testicular torsion      Past Surgical History:  Procedure Laterality Date  . KNEE SURGERY    . testicular torsion repair       Family History  Problem Relation Age of Onset  . Diabetes Father   . Healthy Sister   . Diabetes Brother   . Healthy Son   . Healthy Brother    . Healthy Son      Social History   Substance and Sexual Activity  Drug Use No     Social History   Substance and Sexual Activity  Alcohol Use Yes  . Alcohol/week: 1.0 standard drinks  . Types: 1 Cans of beer per week     Social History   Tobacco Use  Smoking Status Never Smoker  Smokeless Tobacco Never Used     Outpatient Encounter Medications as of 07/06/2018  Medication Sig  . Cholecalciferol (VITAMIN D PO) Take 1 tablet by mouth daily.  . Multiple Vitamin (MULTIVITAMIN WITH MINERALS) TABS tablet Take 1 tablet by mouth daily.  . Omega-3 Fatty Acids (FISH OIL PO) Take 1 tablet by mouth daily.   Facility-Administered Encounter Medications as of 07/06/2018  Medication  . nitroGLYCERIN (NITROSTAT) SL tablet 0.3 mg    Allergies: Patient has no known allergies.  Body mass index is 34.92 kg/m.  Blood pressure 121/64, pulse (!) 56, height 6' 1.5" (1.867 m), weight 268 lb 4.8 oz (121.7 kg), SpO2 99 %.  Review of Systems  Constitutional: Positive for fatigue. Negative for activity change, appetite change, chills, diaphoresis, fever and unexpected weight change.  HENT: Negative for congestion.   Eyes: Negative for visual disturbance.  Respiratory: Negative for cough, chest tightness, shortness of breath, wheezing and stridor.   Cardiovascular: Negative for chest pain, palpitations and leg swelling.  Gastrointestinal: Negative for abdominal distention,  abdominal pain, blood in stool, constipation, diarrhea, nausea and vomiting.  Endocrine: Negative for cold intolerance, heat intolerance, polydipsia, polyphagia and polyuria.  Genitourinary: Negative for difficulty urinating, flank pain and hematuria.  Musculoskeletal: Positive for arthralgias. Negative for back pain, gait problem, joint swelling, myalgias, neck pain and neck stiffness.  Skin: Negative for color change, pallor, rash and wound.  Neurological: Negative for dizziness and headaches.  Hematological: Does  not bruise/bleed easily.  Psychiatric/Behavioral: Positive for sleep disturbance. Negative for behavioral problems, confusion, decreased concentration, dysphoric mood, hallucinations, self-injury and suicidal ideas. The patient is not nervous/anxious and is not hyperactive.        Objective:   Physical Exam  Constitutional: He is oriented to person, place, and time. He appears well-developed and well-nourished. No distress.  HENT:  Head: Normocephalic and atraumatic.  Right Ear: External ear normal. Tympanic membrane is not erythematous and not bulging. No decreased hearing is noted.  Left Ear: External ear normal. Tympanic membrane is not erythematous and not bulging. No decreased hearing is noted.  Nose: No mucosal edema or rhinorrhea. Right sinus exhibits no maxillary sinus tenderness and no frontal sinus tenderness. Left sinus exhibits no maxillary sinus tenderness and no frontal sinus tenderness.  Mouth/Throat: Oropharynx is clear and moist. Mucous membranes are not pale. Normal dentition. No oropharyngeal exudate, posterior oropharyngeal edema or posterior oropharyngeal erythema. Tonsils are 0 on the right. Tonsils are 0 on the left. No tonsillar exudate.  Eyes: Pupils are equal, round, and reactive to light. Conjunctivae and EOM are normal.  Neck: Normal range of motion. Neck supple.  Cardiovascular: Normal rate, regular rhythm, normal heart sounds and intact distal pulses.  No murmur heard. Pulmonary/Chest: Effort normal and breath sounds normal. No stridor. No respiratory distress. He has no wheezes. He has no rales. He exhibits no tenderness.  Abdominal: Soft. Bowel sounds are normal. He exhibits no distension and no mass. There is no tenderness. There is no rebound and no guarding. No hernia.  Genitourinary: Prostate normal. Rectal exam shows guaiac negative stool. No penile tenderness.  Genitourinary Comments: Chaperone present during examination.  Musculoskeletal: Normal range of  motion. He exhibits no edema.  Lymphadenopathy:    He has no cervical adenopathy.  Neurological: He is alert and oriented to person, place, and time. He displays normal reflexes. Coordination normal.  Skin: Skin is warm and dry. Capillary refill takes less than 2 seconds. No rash noted. He is not diaphoretic. No erythema. No pallor.  Psychiatric: He has a normal mood and affect. His behavior is normal. Judgment and thought content normal.  Nursing note and vitals reviewed.     Assessment & Plan:   1. Need for Tdap vaccination   2. Need for influenza vaccination   3. Bilateral hearing loss, unspecified hearing loss type   4. Healthcare maintenance   5. Elevated LDL cholesterol level     Healthcare maintenance Increase water intake and follow Mediterranean Diet. Continue your excellent level of activity! Overall you labs looks good, just reduce saturated fat to improve LDL cholesterol. Referral to Audiology placed, re: evaluate hearing.  Recommend annual physical with fasting labs.  Elevated LDL cholesterol level The 10-year ASCVD risk score Mikey Bussing DC Jr., et al., 2013) is: 6.3%   Values used to calculate the score:     Age: 89 years     Sex: Male     Is Non-Hispanic African American: Yes     Diabetic: No     Tobacco smoker: No  Systolic Blood Pressure: 583 mmHg     Is BP treated: No     HDL Cholesterol: 45 mg/dL     Total Cholesterol: 175 mg/dL  LDL- 115 Reduce saturated fat and continue excellent level of activity  Continue OTC Fish Oil    Bilateral hearing loss Audiology referral placed    FOLLOW-UP:  Return in about 1 year (around 07/07/2019).

## 2018-07-06 NOTE — Assessment & Plan Note (Signed)
The 10-year ASCVD risk score Mikey Bussing DC Brooke Bonito., et al., 2013) is: 6.3%   Values used to calculate the score:     Age: 57 years     Sex: Male     Is Non-Hispanic African American: Yes     Diabetic: No     Tobacco smoker: No     Systolic Blood Pressure: 395 mmHg     Is BP treated: No     HDL Cholesterol: 45 mg/dL     Total Cholesterol: 175 mg/dL  LDL- 115 Reduce saturated fat and continue excellent level of activity  Continue OTC Fish Oil

## 2018-07-06 NOTE — Patient Instructions (Signed)
Preventive Care for Adults, Male A healthy lifestyle and preventive care can promote health and wellness. Preventive health guidelines for men include the following key practices:  A routine yearly physical is a good way to check with your health care provider about your health and preventative screening. It is a chance to share any concerns and updates on your health and to receive a thorough exam.  Visit your dentist for a routine exam and preventative care every 6 months. Brush your teeth twice a day and floss once a day. Good oral hygiene prevents tooth decay and gum disease.  The frequency of eye exams is based on your age, health, family medical history, use of contact lenses, and other factors. Follow your health care provider's recommendations for frequency of eye exams.  Eat a healthy diet. Foods such as vegetables, fruits, whole grains, low-fat dairy products, and lean protein foods contain the nutrients you need without too many calories. Decrease your intake of foods high in solid fats, added sugars, and salt. Eat the right amount of calories for you.Get information about a proper diet from your health care provider, if necessary.  Regular physical exercise is one of the most important things you can do for your health. Most adults should get at least 150 minutes of moderate-intensity exercise (any activity that increases your heart rate and causes you to sweat) each week. In addition, most adults need muscle-strengthening exercises on 2 or more days a week.  Maintain a healthy weight. The body mass index (BMI) is a screening tool to identify possible weight problems. It provides an estimate of body fat based on height and weight. Your health care provider can find your BMI and can help you achieve or maintain a healthy weight.For adults 20 years and older:  A BMI below 18.5 is considered underweight.  A BMI of 18.5 to 24.9 is normal.  A BMI of 25 to 29.9 is considered  overweight.  A BMI of 30 and above is considered obese.  Maintain normal blood lipids and cholesterol levels by exercising and minimizing your intake of saturated fat. Eat a balanced diet with plenty of fruit and vegetables. Blood tests for lipids and cholesterol should begin at age 55 and be repeated every 5 years. If your lipid or cholesterol levels are high, you are over 50, or you are at high risk for heart disease, you may need your cholesterol levels checked more frequently.Ongoing high lipid and cholesterol levels should be treated with medicines if diet and exercise are not working.  If you smoke, find out from your health care provider how to quit. If you do not use tobacco, do not start.  Lung cancer screening is recommended for adults aged 90-80 years who are at high risk for developing lung cancer because of a history of smoking. A yearly low-dose CT scan of the lungs is recommended for people who have at least a 30-pack-year history of smoking and are a current smoker or have quit within the past 15 years. A pack year of smoking is smoking an average of 1 pack of cigarettes a day for 1 year (for example: 1 pack a day for 30 years or 2 packs a day for 15 years). Yearly screening should continue until the smoker has stopped smoking for at least 15 years. Yearly screening should be stopped for people who develop a health problem that would prevent them from having lung cancer treatment.  If you choose to drink alcohol, do not have more  than 2 drinks per day. One drink is considered to be 12 ounces (355 mL) of beer, 5 ounces (148 mL) of wine, or 1.5 ounces (44 mL) of liquor.  Avoid use of street drugs. Do not share needles with anyone. Ask for help if you need support or instructions about stopping the use of drugs.  High blood pressure causes heart disease and increases the risk of stroke. Your blood pressure should be checked at least every 1-2 years. Ongoing high blood pressure should be  treated with medicines, if weight loss and exercise are not effective.  If you are 34-90 years old, ask your health care provider if you should take aspirin to prevent heart disease.  Diabetes screening is done by taking a blood sample to check your blood glucose level after you have not eaten for a certain period of time (fasting). If you are not overweight and you do not have risk factors for diabetes, you should be screened once every 3 years starting at age 35. If you are overweight or obese and you are 70-84 years of age, you should be screened for diabetes every year as part of your cardiovascular risk assessment.  Colorectal cancer can be detected and often prevented. Most routine colorectal cancer screening begins at the age of 18 and continues through age 69. However, your health care provider may recommend screening at an earlier age if you have risk factors for colon cancer. On a yearly basis, your health care provider may provide home test kits to check for hidden blood in the stool. Use of a small camera at the end of a tube to directly examine the colon (sigmoidoscopy or colonoscopy) can detect the earliest forms of colorectal cancer. Talk to your health care provider about this at age 71, when routine screening begins. Direct exam of the colon should be repeated every 5-10 years through age 18, unless early forms of precancerous polyps or small growths are found.  People who are at an increased risk for hepatitis B should be screened for this virus. You are considered at high risk for hepatitis B if:  You were born in a country where hepatitis B occurs often. Talk with your health care provider about which countries are considered high risk.  Your parents were born in a high-risk country and you have not received a shot to protect against hepatitis B (hepatitis B vaccine).  You have HIV or AIDS.  You use needles to inject street drugs.  You live with, or have sex with, someone who  has hepatitis B.  You are a man who has sex with other men (MSM).  You get hemodialysis treatment.  You take certain medicines for conditions such as cancer, organ transplantation, and autoimmune conditions.  Hepatitis C blood testing is recommended for all people born from [birth year] through 1965 and any individual with known risks for hepatitis C.  Practice safe sex. Use condoms and avoid high-risk sexual practices to reduce the spread of sexually transmitted infections (STIs). STIs include gonorrhea, chlamydia, syphilis, trichomonas, herpes, HPV, and human immunodeficiency virus (HIV). Herpes, HIV, and HPV are viral illnesses that have no cure. They can result in disability, cancer, and death.  If you are a man who has sex with other men, you should be screened at least once per year for:  HIV.  Urethral, rectal, and pharyngeal infection of gonorrhea, chlamydia, or both.  If you are at risk of being infected with HIV, it is recommended that you take a  prescription medicine daily to prevent HIV infection. This is called preexposure prophylaxis (PrEP). You are considered at risk if:  You are a man who has sex with other men (MSM) and have other risk factors.  You are a heterosexual man, are sexually active, and are at increased risk for HIV infection.  You take drugs by injection.  You are sexually active with a partner who has HIV.  Talk with your health care provider about whether you are at high risk of being infected with HIV. If you choose to begin PrEP, you should first be tested for HIV. You should then be tested every 3 months for as long as you are taking PrEP.  A one-time screening for abdominal aortic aneurysm (AAA) and surgical repair of large AAAs by ultrasound are recommended for men ages 44 to 66 years who are current or former smokers.  Healthy men should no longer receive prostate-specific antigen (PSA) blood tests as part of routine cancer screening. Talk with your health  care provider about prostate cancer screening.  Testicular cancer screening is not recommended for adult males who have no symptoms. Screening includes self-exam, a health care provider exam, and other screening tests. Consult with your health care provider about any symptoms you have or any concerns you have about testicular cancer.  Use sunscreen. Apply sunscreen liberally and repeatedly throughout the day. You should seek shade when your shadow is shorter than you. Protect yourself by wearing long sleeves, pants, a wide-brimmed hat, and sunglasses year round, whenever you are outdoors.  Once a month, do a whole-body skin exam, using a mirror to look at the skin on your back. Tell your health care provider about new moles, moles that have irregular borders, moles that are larger than a pencil eraser, or moles that have changed in shape or color.  Stay current with required vaccines (immunizations).  Influenza vaccine. All adults should be immunized every year.  Tetanus, diphtheria, and acellular pertussis (Td, Tdap) vaccine. An adult who has not previously received Tdap or who does not know his vaccine status should receive 1 dose of Tdap. This initial dose should be followed by tetanus and diphtheria toxoids (Td) booster doses every 10 years. Adults with an unknown or incomplete history of completing a 3-dose immunization series with Td-containing vaccines should begin or complete a primary immunization series including a Tdap dose. Adults should receive a Td booster every 10 years.  Varicella vaccine. An adult without evidence of immunity to varicella should receive 2 doses or a second dose if he has previously received 1 dose.  Human papillomavirus (HPV) vaccine. Males aged 11-21 years who have not received the vaccine previously should receive the 3-dose series. Males aged 22-26 years may be immunized. Immunization is recommended through the age of 23 years for any male who has sex with males  and did not get any or all doses earlier. Immunization is recommended for any person with an immunocompromised condition through the age of 72 years if he did not get any or all doses earlier. During the 3-dose series, the second dose should be obtained 4-8 weeks after the first dose. The third dose should be obtained 24 weeks after the first dose and 16 weeks after the second dose.  Zoster vaccine. One dose is recommended for adults aged 23 years or older unless certain conditions are present.  Measles, mumps, and rubella (MMR) vaccine. Adults born before [birth year] generally are considered immune to measles and mumps. Adults born in [birth year]  or later should have 1 or more doses of MMR vaccine unless there is a contraindication to the vaccine or there is laboratory evidence of immunity to each of the three diseases. A routine second dose of MMR vaccine should be obtained at least 28 days after the first dose for students attending postsecondary schools, health care workers, or international travelers. People who received inactivated measles vaccine or an unknown type of measles vaccine during 1963-1967 should receive 2 doses of MMR vaccine. People who received inactivated mumps vaccine or an unknown type of mumps vaccine before 1979 and are at high risk for mumps infection should consider immunization with 2 doses of MMR vaccine. Unvaccinated health care workers born before [birth year] who lack laboratory evidence of measles, mumps, or rubella immunity or laboratory confirmation of disease should consider measles and mumps immunization with 2 doses of MMR vaccine or rubella immunization with 1 dose of MMR vaccine.  Pneumococcal 13-valent conjugate (PCV13) vaccine. When indicated, a person who is uncertain of his immunization history and has no record of immunization should receive the PCV13 vaccine. All adults 9 years of age and older should receive this vaccine. An adult aged 69 years or older who has certain medical  conditions and has not been previously immunized should receive 1 dose of PCV13 vaccine. This PCV13 should be followed with a dose of pneumococcal polysaccharide (PPSV23) vaccine. Adults who are at high risk for pneumococcal disease should obtain the PPSV23 vaccine at least 8 weeks after the dose of PCV13 vaccine. Adults older than 56 years of age who have normal immune system function should obtain the PPSV23 vaccine dose at least 1 year after the dose of PCV13 vaccine.  Pneumococcal polysaccharide (PPSV23) vaccine. When PCV13 is also indicated, PCV13 should be obtained first. All adults aged 79 years and older should be immunized. An adult younger than age 43 years who has certain medical conditions should be immunized. Any person who resides in a nursing home or long-term care facility should be immunized. An adult smoker should be immunized. People with an immunocompromised condition and certain other conditions should receive both PCV13 and PPSV23 vaccines. People with human immunodeficiency virus (HIV) infection should be immunized as soon as possible after diagnosis. Immunization during chemotherapy or radiation therapy should be avoided. Routine use of PPSV23 vaccine is not recommended for American Indians, [place] Natives, or people younger than 65 years unless there are medical conditions that require PPSV23 vaccine. When indicated, people who have unknown immunization and have no record of immunization should receive PPSV23 vaccine. One-time revaccination 5 years after the first dose of PPSV23 is recommended for people aged 19-64 years who have chronic kidney failure, nephrotic syndrome, asplenia, or immunocompromised conditions. People who received 1-2 doses of PPSV23 before age 70 years should receive another dose of PPSV23 vaccine at age 79 years or later if at least 5 years have passed since the previous dose. Doses of PPSV23 are not needed for people immunized with PPSV23 at or after age 55  years.  Meningococcal vaccine. Adults with asplenia or persistent complement component deficiencies should receive 2 doses of quadrivalent meningococcal conjugate (MenACWY-D) vaccine. The doses should be obtained at least 2 months apart. Microbiologists working with certain meningococcal bacteria, [employer] recruits, people at risk during an outbreak, and people who travel to or live in countries with a high rate of meningitis should be immunized. A first-year college student up through age 64 years who is living in a residence hall should receive a  dose if he did not receive a dose on or after his 16th birthday. Adults who have certain high-risk conditions should receive one or more doses of vaccine.  Hepatitis A vaccine. Adults who wish to be protected from this disease, have chronic liver disease, work with hepatitis A-infected animals, work in hepatitis A research labs, or travel to or work in countries with a high rate of hepatitis A should be immunized. Adults who were previously unvaccinated and who anticipate close contact with an international adoptee during the first 60 days after arrival in the [place] from a country with a high rate of hepatitis A should be immunized.  Hepatitis B vaccine. Adults should be immunized if they wish to be protected from this disease, are under age 34 years and have diabetes, have chronic liver disease, have had more than one sex partner in the past 6 months, may be exposed to blood or other infectious body fluids, are household contacts or sex partners of hepatitis B positive people, are clients or workers in certain care facilities, or travel to or work in countries with a high rate of hepatitis B.  Haemophilus influenzae type b (Hib) vaccine. A previously unvaccinated person with asplenia or sickle cell disease or having a scheduled splenectomy should receive 1 dose of Hib vaccine. Regardless of previous immunization, a recipient of a hematopoietic stem cell  transplant should receive a 3-dose series 6-12 months after his successful transplant. Hib vaccine is not recommended for adults with HIV infection. Preventive Service / Frequency Ages 77 to 55  Blood pressure check.** / Every 3-5 years.  Lipid and cholesterol check.** / Every 5 years beginning at age 66.  Hepatitis C blood test.** / For any individual with known risks for hepatitis C.  Skin self-exam. / Monthly.  Influenza vaccine. / Every year.  Tetanus, diphtheria, and acellular pertussis (Tdap, Td) vaccine.** / Consult your health care provider. 1 dose of Td every 10 years.  Varicella vaccine.** / Consult your health care provider.  HPV vaccine. / 3 doses over 6 months, if 45 or younger.  Measles, mumps, rubella (MMR) vaccine.** / You need at least 1 dose of MMR if you were born in 1957 or later. You may also need a second dose.  Pneumococcal 13-valent conjugate (PCV13) vaccine.** / Consult your health care provider.  Pneumococcal polysaccharide (PPSV23) vaccine.** / 1 to 2 doses if you smoke cigarettes or if you have certain conditions.  Meningococcal vaccine.** / 1 dose if you are age 81 to 79 years and a Market researcher living in a residence hall, or have one of several medical conditions. You may also need additional booster doses.  Hepatitis A vaccine.** / Consult your health care provider.  Hepatitis B vaccine.** / Consult your health care provider.  Haemophilus influenzae type b (Hib) vaccine.** / Consult your health care provider. Ages 6 to 58  Blood pressure check.** / Every year.  Lipid and cholesterol check.** / Every 5 years beginning at age 89.  Lung cancer screening. / Every year if you are aged 84-80 years and have a 30-pack-year history of smoking and currently smoke or have quit within the past 15 years. Yearly screening is stopped once you have quit smoking for at least 15 years or develop a health problem that would prevent you from having  lung cancer treatment.  Fecal occult blood test (FOBT) of stool. / Every year beginning at age 90 and continuing until age 73. You may not have to do  this test if you get a colonoscopy every 10 years.  Flexible sigmoidoscopy** or colonoscopy.** / Every 5 years for a flexible sigmoidoscopy or every 10 years for a colonoscopy beginning at age 50 and continuing until age 75.  Hepatitis C blood test.** / For all people born from 1945 through 1965 and any individual with known risks for hepatitis C.  Skin self-exam. / Monthly.  Influenza vaccine. / Every year.  Tetanus, diphtheria, and acellular pertussis (Tdap/Td) vaccine.** / Consult your health care provider. 1 dose of Td every 10 years.  Varicella vaccine.** / Consult your health care provider.  Zoster vaccine.** / 1 dose for adults aged 60 years or older.  Measles, mumps, rubella (MMR) vaccine.** / You need at least 1 dose of MMR if you were born in 1957 or later. You may also need a second dose.  Pneumococcal 13-valent conjugate (PCV13) vaccine.** / Consult your health care provider.  Pneumococcal polysaccharide (PPSV23) vaccine.** / 1 to 2 doses if you smoke cigarettes or if you have certain conditions.  Meningococcal vaccine.** / Consult your health care provider.  Hepatitis A vaccine.** / Consult your health care provider.  Hepatitis B vaccine.** / Consult your health care provider.  Haemophilus influenzae type b (Hib) vaccine.** / Consult your health care provider. Ages 65 and over  Blood pressure check.** / Every year.  Lipid and cholesterol check.**/ Every 5 years beginning at age 20.  Lung cancer screening. / Every year if you are aged 55-80 years and have a 30-pack-year history of smoking and currently smoke or have quit within the past 15 years. Yearly screening is stopped once you have quit smoking for at least 15 years or develop a health problem that would prevent you from having lung cancer treatment.  Fecal  occult blood test (FOBT) of stool. / Every year beginning at age 50 and continuing until age 75. You may not have to do this test if you get a colonoscopy every 10 years.  Flexible sigmoidoscopy** or colonoscopy.** / Every 5 years for a flexible sigmoidoscopy or every 10 years for a colonoscopy beginning at age 50 and continuing until age 75.  Hepatitis C blood test.** / For all people born from 1945 through 1965 and any individual with known risks for hepatitis C.  Abdominal aortic aneurysm (AAA) screening.** / A one-time screening for ages 65 to 75 years who are current or former smokers.  Skin self-exam. / Monthly.  Influenza vaccine. / Every year.  Tetanus, diphtheria, and acellular pertussis (Tdap/Td) vaccine.** / 1 dose of Td every 10 years.  Varicella vaccine.** / Consult your health care provider.  Zoster vaccine.** / 1 dose for adults aged 60 years or older.  Pneumococcal 13-valent conjugate (PCV13) vaccine.** / 1 dose for all adults aged 65 years and older.  Pneumococcal polysaccharide (PPSV23) vaccine.** / 1 dose for all adults aged 65 years and older.  Meningococcal vaccine.** / Consult your health care provider.  Hepatitis A vaccine.** / Consult your health care provider.  Hepatitis B vaccine.** / Consult your health care provider.  Haemophilus influenzae type b (Hib) vaccine.** / Consult your health care provider. **Family history and personal history of risk and conditions may change your health care provider's recommendations.   This information is not intended to replace advice given to you by your health care provider. Make sure you discuss any questions you have with your health care provider.   Document Released: 11/12/2001 Document Revised: 10/07/2014 Document Reviewed: 02/11/2011 Elsevier Interactive Patient Education 2016   [place] refers to food and lifestyle choices that are based on the traditions of  countries located on the [employer]. This way of eating has been shown to help prevent certain conditions and improve outcomes for people who have chronic diseases, like kidney disease and heart disease. What are tips for following this plan? Lifestyle  Cook and eat meals together with your family, when possible.  Drink enough fluid to keep your urine clear or pale yellow.  Be physically active every day. This includes: ? Aerobic exercise like running or swimming. ? Leisure activities like gardening, walking, or housework.  Get 7-8 hours of sleep each night.  If recommended by your health care provider, drink red wine in moderation. This means 1 glass a day for nonpregnant women and 2 glasses a day for men. A glass of wine equals 5 oz (150 mL). Reading food labels  Check the serving size of packaged foods. For foods such as rice and pasta, the serving size refers to the amount of cooked product, not dry.  Check the total fat in packaged foods. Avoid foods that have saturated fat or trans fats.  Check the ingredients list for added sugars, such as corn syrup. Shopping  At the grocery store, buy most of your food from the areas near the walls of the store. This includes: ? Fresh fruits and vegetables (produce). ? Grains, beans, nuts, and seeds. Some of these may be available in unpackaged forms or large amounts (in bulk). ? Fresh seafood. ? Poultry and eggs. ? Low-fat dairy products.  Buy whole ingredients instead of prepackaged foods.  Buy fresh fruits and vegetables in-season from local farmers markets.  Buy frozen fruits and vegetables in resealable bags.  If you do not have access to quality fresh seafood, buy precooked frozen shrimp or canned fish, such as tuna, salmon, or sardines.  Buy small amounts of raw or cooked vegetables, salads, or olives from the deli or salad bar at your store.  Stock your pantry so you always have certain foods on hand, such as olive  oil, canned tuna, canned tomatoes, rice, pasta, and beans. Cooking  Cook foods with extra-virgin olive oil instead of using butter or other vegetable oils.  Have meat as a side dish, and have vegetables or grains as your main dish. This means having meat in small portions or adding small amounts of meat to foods like pasta or stew.  Use beans or vegetables instead of meat in common dishes like chili or lasagna.  Experiment with different cooking methods. Try roasting or broiling vegetables instead of steaming or sauteing them.  Add frozen vegetables to soups, stews, pasta, or rice.  Add nuts or seeds for added healthy fat at each meal. You can add these to yogurt, salads, or vegetable dishes.  Marinate fish or vegetables using olive oil, lemon juice, garlic, and fresh herbs. Meal planning  Plan to eat 1 vegetarian meal one day each week. Try to work up to 2 vegetarian meals, if possible.  Eat seafood 2 or more times a week.  Have healthy snacks readily available, such as: ? Vegetable sticks with hummus. ? [place] yogurt. ? Fruit and nut trail mix.  Eat balanced meals throughout the week. This includes: ? Fruit: 2-3 servings a day ? Vegetables: 4-5 servings a day ? Low-fat dairy: 2 servings a day ? Fish, poultry, or lean meat: 1 serving a day ? Beans and legumes: 2 or more  servings a week ? Nuts and seeds: 1-2 servings a day ? Whole grains: 6-8 servings a day ? Extra-virgin olive oil: 3-4 servings a day  Limit red meat and sweets to only a few servings a month What are my food choices?  Mediterranean diet ? Recommended ? Grains: Whole-grain pasta. Brown rice. Bulgar wheat. Polenta. Couscous. Whole-wheat bread. [place]. ? Vegetables: Artichokes. Beets. Broccoli. Cabbage. Carrots. Eggplant. Green beans. Chard. Kale. Spinach. Onions. Leeks. Peas. Squash. Tomatoes. Peppers. Radishes. ? Fruits: Apples. Apricots. Avocado. Berries. Bananas. Cherries. Dates. Figs. Grapes.  Lemons. Melon. Oranges. Peaches. Plums. Pomegranate. ? Meats and other protein foods: Beans. Almonds. Sunflower seeds. Pine nuts. Peanuts. [place]. Salmon. Scallops. Shrimp. [place]. Tilapia. Clams. Oysters. Eggs. ? Dairy: Low-fat milk. Cheese. Greek yogurt. ? Beverages: Water. Red wine. Herbal tea. ? Fats and oils: Extra virgin olive oil. Avocado oil. Grape seed oil. ? Sweets and desserts: [place] yogurt with honey. Baked apples. Poached pears. Trail mix. ? Seasoning and other foods: Basil. Cilantro. Coriander. Cumin. Mint. Parsley. Sage. Rosemary. Tarragon. Garlic. Oregano. Thyme. Pepper. Balsalmic vinegar. Tahini. Hummus. Tomato sauce. Olives. Mushrooms. ? Limit these ? Grains: Prepackaged pasta or rice dishes. Prepackaged cereal with added sugar. ? Vegetables: Deep fried potatoes (french fries). ? Fruits: Fruit canned in syrup. ? Meats and other protein foods: Beef. Pork. Lamb. Poultry with skin. Hot dogs. [place] Salines. ? Dairy: Ice cream. Sour cream. Whole milk. ? Beverages: Juice. Sugar-sweetened soft drinks. Beer. Liquor and spirits. ? Fats and oils: Butter. Canola oil. Vegetable oil. Beef fat (tallow). Lard. ? Sweets and desserts: Cookies. Cakes. Pies. Candy. ? Seasoning and other foods: Mayonnaise. Premade sauces and marinades. ? The items listed may not be a complete list. Talk with your dietitian about what dietary choices are right for you. Summary  The Mediterranean diet includes both food and lifestyle choices.  Eat a variety of fresh fruits and vegetables, beans, nuts, seeds, and whole grains.  Limit the amount of red meat and sweets that you eat.  Talk with your health care provider about whether it is safe for you to drink red wine in moderation. This means 1 glass a day for nonpregnant women and 2 glasses a day for men. A glass of wine equals 5 oz (150 mL). This information is not intended to replace advice given to you by your health care provider. Make sure you discuss any questions  you have with your health care provider. Document Released: 05/09/2016 Document Revised: 06/11/2016 Document Reviewed: 05/09/2016 Elsevier Interactive Patient Education  2018 [employer].   Increase water intake and follow Mediterranean Diet. Continue your excellent level of activity! Overall you labs looks good, just reduce saturated fat to improve LDL cholesterol. Referral to Audiology placed, re: evaluate hearing.  Recommend annual physical with fasting labs. NICE TO SEE YOU!

## 2018-07-06 NOTE — Assessment & Plan Note (Signed)
Audiology referral placed.

## 2019-03-05 DIAGNOSIS — Z7189 Other specified counseling: Secondary | ICD-10-CM | POA: Diagnosis not present

## 2019-03-05 DIAGNOSIS — Z20828 Contact with and (suspected) exposure to other viral communicable diseases: Secondary | ICD-10-CM | POA: Diagnosis not present

## 2019-05-01 ENCOUNTER — Other Ambulatory Visit: Payer: Self-pay | Admitting: Adult Health

## 2019-05-01 DIAGNOSIS — Z20822 Contact with and (suspected) exposure to covid-19: Secondary | ICD-10-CM

## 2019-05-02 LAB — NOVEL CORONAVIRUS, NAA: SARS-CoV-2, NAA: NOT DETECTED

## 2019-07-12 ENCOUNTER — Ambulatory Visit (INDEPENDENT_AMBULATORY_CARE_PROVIDER_SITE_OTHER): Payer: Federal, State, Local not specified - PPO | Admitting: Adult Health

## 2019-07-12 ENCOUNTER — Encounter: Payer: Self-pay | Admitting: Adult Health

## 2019-07-12 ENCOUNTER — Other Ambulatory Visit: Payer: Self-pay

## 2019-07-12 VITALS — BP 123/72 | HR 59 | Temp 98.6°F | Ht 73.5 in | Wt 277.1 lb

## 2019-07-12 DIAGNOSIS — E7800 Pure hypercholesterolemia, unspecified: Secondary | ICD-10-CM | POA: Diagnosis not present

## 2019-07-12 DIAGNOSIS — Z0000 Encounter for general adult medical examination without abnormal findings: Secondary | ICD-10-CM | POA: Diagnosis not present

## 2019-07-12 DIAGNOSIS — K429 Umbilical hernia without obstruction or gangrene: Secondary | ICD-10-CM | POA: Insufficient documentation

## 2019-07-12 DIAGNOSIS — Z1211 Encounter for screening for malignant neoplasm of colon: Secondary | ICD-10-CM | POA: Diagnosis not present

## 2019-07-12 DIAGNOSIS — Z23 Encounter for immunization: Secondary | ICD-10-CM

## 2019-07-12 DIAGNOSIS — Z6836 Body mass index (BMI) 36.0-36.9, adult: Secondary | ICD-10-CM | POA: Insufficient documentation

## 2019-07-12 DIAGNOSIS — K921 Melena: Secondary | ICD-10-CM

## 2019-07-12 LAB — POC HEMOCCULT BLD/STL (OFFICE/1-CARD/DIAGNOSTIC): Fecal Occult Blood, POC: NEGATIVE

## 2019-07-12 NOTE — Assessment & Plan Note (Signed)
Reducible, non-tender umbilical hernia Discussed Red Flag sx's and when to seek immediate medical care

## 2019-07-12 NOTE — Assessment & Plan Note (Signed)
The 10-year ASCVD risk score Mikey Bussing DC Brooke Bonito., et al., 2013) is: 6.7%   Values used to calculate the score:     Age: 58 years     Sex: Male     Is Non-Hispanic African American: Yes     Diabetic: No     Tobacco smoker: No     Systolic Blood Pressure: [id number] mmHg     Is BP treated: No     HDL Cholesterol: 45 mg/dL     Total Cholesterol: 175 mg/dL   Needs fasting labs

## 2019-07-12 NOTE — Assessment & Plan Note (Signed)
Please schedule fasting lab appt in the next 1-2 weeks. Remain well hydrated, follow heart healthy diet. Since you have been experiencing blood in your stool the last year- recommend following-up with Gastroenterologist (GI) sooner than 2021 when your Colonsocopy is due to updated. Your last Colonoscopy was with [facility] [phone]. It was difficult to feel your prostate- if you develop any urinary symptoms (ie: urinary frequency, urinary hesitancy, or blood in urine)- please call clinic and we will refer you to Urology.  Continue to social distance and wear a mask when in public. Recommend annual physical.

## 2019-07-12 NOTE — Progress Notes (Addendum)
Subjective:    Patient ID: Wesley King, male    DOB: June 14, 1961, 58 y.o.   MRN: [id number]  HPI: 06/05/18 OV: Wesley King is here to establish as a new pt.  He is a pleasant 58 year old male. PMH: Obesity and elevated LDL- 137 on 12/08/17 He only takes OTC supplements and has a rigorous exercise regime- biking, golfing, swimming, walking, and weight lifting. He has been following a "clean diet" the last 3 weeks and has lost 4-6 lbs, current wt 263. His goal wt is 245 He denies tobacco use and seldom drinks ETOH He denies current pain, however has hx of intermittent back pain since 2005.  Pain r/t to repetitive use while at work at [employer] (ie. Heavy lifting, bending/pushing/pulling, hours of standing on concrete floor), denies acute injury/trauma prior to onset of back pain. He is treated by local Orthopedic Specialist and has FMLA paperwork that needs to be completed, advised to ask treating provider to accomplish form.  07/06/18 OV: Wesley King presents for CPE He continues to remains extremely active and has been trying to improve his eating habits. He denies acute complaints/issues today  Healthcare Maintenance: Colonoscopy-UTD, next due 2021 Immunizations-Tdap and Flu updated today LDCT-N/A AAA Screening-N/A  07/12/2019 OV:  Wesley King is here for CPE He estimates to drink >60 oz water/day and reports increased appetite, especially snacking. He has gained ?9 lbs since last OV Current wt 277 Body mass index is 36.06 kg/m.  He continues to remain very active- walking daily, basketball/wt lifting- 5 days/week. He continues to  Healthcare Maintenance: Colonoscopy-Due 2021- since he has been experiencing intermittent hematochezia the last yr-recommend following up with GI sooner Immunizations-Influenza updated today LDCT-N/A AAA Screening-N/A Patient Care Team    Relationship Specialty Notifications Start End  Esaw Grandchild, NP PCP - General Family Medicine  06/05/18    Gastroenterology, [facility]    06/08/18     Patient Active Problem List   Diagnosis Date Noted  . BMI 36.0-36.9,adult 07/12/2019  . Hematochezia 07/12/2019  . Umbilical hernia [id number]  . Bilateral hearing loss 07/06/2018  . Healthcare maintenance 06/05/2018  . Elevated LDL cholesterol level 06/05/2018     Past Medical History:  Diagnosis Date  . Testicular torsion      Past Surgical History:  Procedure Laterality Date  . KNEE SURGERY    . testicular torsion repair       Family History  Problem Relation Age of Onset  . Diabetes Father   . Healthy Sister   . Diabetes Brother   . Healthy Son   . Healthy Brother   . Healthy Son      Social History   Substance and Sexual Activity  Drug Use No     Social History   Substance and Sexual Activity  Alcohol Use Yes  . Alcohol/week: 1.0 standard drinks  . Types: 1 Cans of beer per week     Social History   Tobacco Use  Smoking Status Never Smoker  Smokeless Tobacco Never Used     Outpatient Encounter Medications as of 07/12/2019  Medication Sig  . Cholecalciferol (VITAMIN D PO) Take 1 tablet by mouth daily.  . Multiple Vitamin (MULTIVITAMIN WITH MINERALS) TABS tablet Take 1 tablet by mouth daily.  . Omega-3 Fatty Acids (FISH OIL PO) Take 1 tablet by mouth daily.   Facility-Administered Encounter Medications as of 07/12/2019  Medication  . nitroGLYCERIN (NITROSTAT) SL tablet 0.3 mg    Allergies: Patient has no known  allergies.  Body mass index is 36.06 kg/m.  Blood pressure 123/72, pulse (!) 59, temperature 98.6 F (37 C), temperature source Oral, height 6' 1.5" (1.867 m), weight 277 lb 1.6 oz (125.7 kg), SpO2 96 %.  Review of Systems  Constitutional: Positive for fatigue. Negative for activity change, appetite change, chills, diaphoresis, fever and unexpected weight change.  HENT: Negative for congestion.   Eyes: Negative for visual disturbance.  Respiratory: Negative for cough, chest  tightness, shortness of breath, wheezing and stridor.   Cardiovascular: Negative for chest pain, palpitations and leg swelling.  Gastrointestinal: Positive for blood in stool. Negative for abdominal distention, anal bleeding, constipation, diarrhea, nausea and vomiting.  Endocrine: Negative for cold intolerance, heat intolerance, polydipsia, polyphagia and polyuria.  Genitourinary: Negative for difficulty urinating, flank pain and hematuria.  Musculoskeletal: Negative for arthralgias, back pain, gait problem, joint swelling, myalgias, neck pain and neck stiffness.  Skin: Negative for color change, pallor, rash and wound.  Neurological: Negative for dizziness and headaches.  Hematological: Negative for adenopathy. Does not bruise/bleed easily.  Psychiatric/Behavioral: Negative for agitation, behavioral problems, confusion, decreased concentration, dysphoric mood, hallucinations, self-injury, sleep disturbance and suicidal ideas. The patient is not nervous/anxious and is not hyperactive.        Objective:   Physical Exam Vitals signs and nursing note reviewed. Exam conducted with a chaperone present.  Constitutional:      General: He is not in acute distress.    Appearance: He is obese. He is not ill-appearing, toxic-appearing or diaphoretic.  HENT:     Head: Normocephalic and atraumatic.     Right Ear: Tympanic membrane, ear canal and external ear normal. There is no impacted cerumen.     Left Ear: Tympanic membrane, ear canal and external ear normal. There is no impacted cerumen.     Nose: Nose normal. No congestion.     Mouth/Throat:     Mouth: Mucous membranes are moist.     Pharynx: No oropharyngeal exudate or posterior oropharyngeal erythema.  Eyes:     Extraocular Movements: Extraocular movements intact.     Conjunctiva/sclera: Conjunctivae normal.     Pupils: Pupils are equal, round, and reactive to light.  Neck:     Musculoskeletal: Normal range of motion and neck supple.   Cardiovascular:     Rate and Rhythm: Normal rate and regular rhythm.     Pulses: Normal pulses.     Heart sounds: Normal heart sounds. No murmur. No friction rub. No gallop.   Pulmonary:     Effort: Pulmonary effort is normal. No respiratory distress.     Breath sounds: Normal breath sounds. No stridor. No wheezing, rhonchi or rales.  Chest:     Chest wall: No tenderness.  Abdominal:     General: Abdomen is protuberant. Bowel sounds are normal. There is no distension.     Palpations: Abdomen is soft.     Tenderness: There is no abdominal tenderness. There is no right CVA tenderness.     Hernia: A hernia is present. Hernia is present in the umbilical area.     Comments: Reducible, non-tender umbilical hernia   Genitourinary:    Rectum: Guaiac result negative.     Comments: Due to body habitus- unable to properly palpate prostate. Recommend Urology referral if urinary sx's develop Chaperone present during examination. Neurological:     Mental Status: He is alert.        Assessment & Plan:   1. Healthcare maintenance   2. Elevated LDL cholesterol  level   3. Need for influenza vaccination   4. Screening for colon cancer   5. BMI 36.0-36.9,adult   6. Hematochezia   7. Umbilical hernia without obstruction and without gangrene     Healthcare maintenance Please schedule fasting lab appt in the next 1-2 weeks. Remain well hydrated, follow heart healthy diet. Since you have been experiencing blood in your stool the last year- recommend following-up with Gastroenterologist (GI) sooner than 2021 when your Colonsocopy is due to updated. Your last Colonoscopy was with [facility] [phone]. It was difficult to feel your prostate- if you develop any urinary symptoms (ie: urinary frequency, urinary hesitancy, or blood in urine)- please call clinic and we will refer you to Urology.  Continue to social distance and wear a mask when in public. Recommend annual  physical.  Elevated LDL cholesterol level The 10-year ASCVD risk score Mikey Bussing DC Jr., et al., 2013) is: 6.7%   Values used to calculate the score:     Age: 3 years     Sex: Male     Is Non-Hispanic African American: Yes     Diabetic: No     Tobacco smoker: No     Systolic Blood Pressure: [id number] mmHg     Is BP treated: No     HDL Cholesterol: 45 mg/dL     Total Cholesterol: 175 mg/dL   Needs fasting labs   Umbilical hernia Reducible, non-tender umbilical hernia Discussed Red Flag sx's and when to seek immediate medical care  Hematochezia Colonoscopy-Due 2021- since he has been experiencing intermittent hematochezia the last yr-recommend following up with GI sooner    FOLLOW-UP:  Return in about 1 year (around 07/11/2020) for CPE, Fasting Labs.

## 2019-07-12 NOTE — Assessment & Plan Note (Signed)
Colonoscopy-Due 2021- since he has been experiencing intermittent hematochezia the last yr-recommend following up with GI sooner

## 2019-07-12 NOTE — Patient Instructions (Addendum)

## 2019-07-19 ENCOUNTER — Encounter: Payer: Self-pay | Admitting: Adult Health

## 2019-07-19 ENCOUNTER — Other Ambulatory Visit: Payer: Self-pay

## 2019-07-19 ENCOUNTER — Other Ambulatory Visit: Payer: Federal, State, Local not specified - PPO

## 2019-07-19 DIAGNOSIS — Z0000 Encounter for general adult medical examination without abnormal findings: Secondary | ICD-10-CM

## 2019-07-19 DIAGNOSIS — E7800 Pure hypercholesterolemia, unspecified: Secondary | ICD-10-CM

## 2019-07-20 LAB — COMPREHENSIVE METABOLIC PANEL
ALT: 26 IU/L (ref 0–44)
AST: 39 IU/L (ref 0–40)
Albumin/Globulin Ratio: 1.5 (ref 1.2–2.2)
Albumin: 4.2 g/dL (ref 3.8–4.9)
Alkaline Phosphatase: 78 IU/L (ref 39–117)
BUN/Creatinine Ratio: 13 (ref 9–20)
BUN: 13 mg/dL (ref 6–24)
Bilirubin Total: 0.6 mg/dL (ref 0.0–1.2)
CO2: 23 mmol/L (ref 20–29)
Calcium: 9.3 mg/dL (ref 8.7–10.2)
Chloride: 105 mmol/L (ref 96–106)
Creatinine, Ser: 0.99 mg/dL (ref 0.76–1.27)
GFR calc Af Amer: 97 mL/min/{1.73_m2} (ref >59)
GFR calc non Af Amer: 84 mL/min/{1.73_m2} (ref >59)
Globulin, Total: 2.8 g/dL (ref 1.5–4.5)
Glucose: 105 mg/dL — ABNORMAL HIGH (ref 65–99)
Potassium: 4.5 mmol/L (ref 3.5–5.2)
Sodium: 140 mmol/L (ref 134–144)
Total Protein: 7 g/dL (ref 6.0–8.5)

## 2019-07-20 LAB — CBC WITH DIFFERENTIAL/PLATELET
Basophils Absolute: 0.1 10*3/uL (ref 0.0–0.2)
Basos: 2 %
EOS (ABSOLUTE): 0.2 10*3/uL (ref 0.0–0.4)
Eos: 4 %
Hematocrit: 39.2 % (ref 37.5–51.0)
Hemoglobin: 13.2 g/dL (ref 13.0–17.7)
Immature Grans (Abs): 0 10*3/uL (ref 0.0–0.1)
Immature Granulocytes: 0 %
Lymphocytes Absolute: 1.9 10*3/uL (ref 0.7–3.1)
Lymphs: 39 %
MCH: 26.6 pg (ref 26.6–33.0)
MCHC: 33.7 g/dL (ref 31.5–35.7)
MCV: 79 fL (ref 79–97)
Monocytes Absolute: 0.4 10*3/uL (ref 0.1–0.9)
Monocytes: 9 %
Neutrophils Absolute: 2.2 10*3/uL (ref 1.4–7.0)
Neutrophils: 46 %
Platelets: 269 10*3/uL (ref 150–450)
RBC: 4.97 x10E6/uL (ref 4.14–5.80)
RDW: 12.2 % (ref 11.6–15.4)
WBC: 4.8 10*3/uL (ref 3.4–10.8)

## 2019-07-20 LAB — LIPID PANEL
Chol/HDL Ratio: 4 ratio (ref 0.0–5.0)
Cholesterol, Total: 198 mg/dL (ref 100–199)
HDL: 50 mg/dL (ref >39)
LDL Chol Calc (NIH): 133 mg/dL — ABNORMAL HIGH (ref 0–99)
Triglycerides: 84 mg/dL (ref 0–149)
VLDL Cholesterol Cal: 15 mg/dL (ref 5–40)

## 2019-07-20 LAB — TSH: TSH: 0.747 u[IU]/mL (ref 0.450–4.500)

## 2019-07-20 LAB — HEMOGLOBIN A1C
Est. average glucose Bld gHb Est-mCnc: 105 mg/dL
Hgb A1c MFr Bld: 5.3 % (ref 4.8–5.6)

## 2019-07-20 LAB — HEPATITIS C ANTIBODY: Hep C Virus Ab: <0.1 s/co ratio (ref 0.0–0.9)

## 2019-07-20 LAB — HIV ANTIBODY (ROUTINE TESTING W REFLEX): HIV Screen 4th Generation wRfx: NONREACTIVE

## 2019-07-23 ENCOUNTER — Other Ambulatory Visit: Payer: Federal, State, Local not specified - PPO

## 2019-08-05 NOTE — Progress Notes (Signed)
Received Epic notification that pt has not read MyChart message regarding results.     Wesley King has received and reviewed your most recent lab results. She asked that I inform you that your hepatitis C and HIV tests were both negative. You A1c (3 month average of blood sugars) is normal at 5.3. Additionally, your thyroid, complete blood count and metabolic panel were all normal.    Your 10-year risk score for sudden acute cardiac event such as a heart attack or stroke is currently 6.8% .  Values used to calculate the score:   Age: 58 years   Sex: Male   Is Non-Hispanic African American: Yes   Diabetic: No   Tobacco smoker: No   Systolic Blood Pressure: [id number] mmHg   Is BP treated: No   HDL (good) cholesterol: 50 mg/dL (normal is greater than 40)   Total Cholesterol: 198 mg/dL (normal is less than 200)  LDL (bad cholesterol) is 133 (normal is less than 99)  Your risk score is still above goal despite regular exercise.  Katy recommends statin therapy at this time. Would you be agreeable to this?  If so, please let us know that she can send in a prescription for this medication. Also, we will need to check liver function tests in 6 weeks if you are agreeable since these medications are metabolized thru the liver and we need to ensure that your liver tolerates the statin drug. Also, we will need to recheck your cholesterol levels in 3 months.   Please let us know your decision for taking the statin medication.     Letter mailed to pt.    Charyl Bigger, CMA

## 2019-08-10 ENCOUNTER — Other Ambulatory Visit: Payer: Self-pay

## 2019-08-10 DIAGNOSIS — Z20822 Contact with and (suspected) exposure to covid-19: Secondary | ICD-10-CM

## 2019-08-11 LAB — NOVEL CORONAVIRUS, NAA: SARS-CoV-2, NAA: NOT DETECTED

## 2019-08-31 ENCOUNTER — Other Ambulatory Visit: Payer: Self-pay

## 2019-08-31 DIAGNOSIS — Z20822 Contact with and (suspected) exposure to covid-19: Secondary | ICD-10-CM

## 2019-09-02 LAB — NOVEL CORONAVIRUS, NAA: SARS-CoV-2, NAA: NOT DETECTED

## 2019-09-29 DIAGNOSIS — Z20828 Contact with and (suspected) exposure to other viral communicable diseases: Secondary | ICD-10-CM | POA: Diagnosis not present

## 2019-09-29 DIAGNOSIS — Z6834 Body mass index (BMI) 34.0-34.9, adult: Secondary | ICD-10-CM | POA: Diagnosis not present

## 2019-12-09 ENCOUNTER — Ambulatory Visit: Payer: Federal, State, Local not specified - PPO | Attending: Internal Medicine

## 2019-12-09 DIAGNOSIS — Z23 Encounter for immunization: Secondary | ICD-10-CM

## 2019-12-09 NOTE — Progress Notes (Signed)
   Covid-19 Vaccination Clinic  Name:  Wesley King    MRN: [id number] DOB: 24-May-1961  12/09/2019  [name] was observed post Covid-19 immunization for 15 minutes without incident. He was provided with Vaccine Information Sheet and instruction to access the V-Safe system.   [name] was instructed to call 911 with any severe reactions post vaccine: Marland Kitchen Difficulty breathing  . Swelling of face and throat  . A fast heartbeat  . A bad rash all over body  . Dizziness and weakness   Immunizations Administered    Name Date Dose VIS Date Route   Pfizer COVID-19 Vaccine 12/09/2019  3:22 PM 0.3 mL 09/10/2019 Intramuscular   Manufacturer: [place]   Lot: [id number]   [place]: [id number]

## 2020-01-03 ENCOUNTER — Ambulatory Visit: Payer: Federal, State, Local not specified - PPO | Attending: Internal Medicine

## 2020-01-03 DIAGNOSIS — Z23 Encounter for immunization: Secondary | ICD-10-CM

## 2020-01-03 NOTE — Progress Notes (Signed)
   Covid-19 Vaccination Clinic  Name:  Wesley King    MRN: [id number] DOB: [birth year]  01/03/2020  [name] was observed post Covid-19 immunization for 15 minutes without incident. He was provided with Vaccine Information Sheet and instruction to access the V-Safe system.   [name] was instructed to call 911 with any severe reactions post vaccine: Marland Kitchen Difficulty breathing  . Swelling of face and throat  . A fast heartbeat  . A bad rash all over body  . Dizziness and weakness   Immunizations Administered    Name Date Dose VIS Date Route   Pfizer COVID-19 Vaccine 01/03/2020  3:19 PM 0.3 mL 09/10/2019 Intramuscular   Manufacturer: [place]   Lot: [id number]   [place]: [id number]

## 2020-02-15 DIAGNOSIS — M2241 Chondromalacia patellae, right knee: Secondary | ICD-10-CM | POA: Diagnosis not present

## 2020-08-06 ENCOUNTER — Ambulatory Visit
Admission: EM | Admit: 2020-08-06 | Discharge: 2020-08-06 | Disposition: A | Discharge status: Home / Self Care | Payer: Federal, State, Local not specified - PPO | Attending: Physician Assistant | Admitting: Physician Assistant

## 2020-08-06 ENCOUNTER — Encounter: Payer: Self-pay | Admitting: Emergency Medicine

## 2020-08-06 ENCOUNTER — Other Ambulatory Visit: Payer: Self-pay

## 2020-08-06 DIAGNOSIS — M25562 Pain in left knee: Secondary | ICD-10-CM

## 2020-08-06 MED ORDER — MELOXICAM 7.5 MG PO TABS: 7.5000 mg | ORAL_TABLET | Freq: Every day | ORAL | 0 refills | Status: DC

## 2020-08-06 NOTE — ED Provider Notes (Signed)
[facility]    CSN: [id number] Arrival date & time: 08/06/20  1212      History   Chief Complaint Chief Complaint  Patient presents with  . Knee Pain    HPI Wesley King is a 59 y.o. male.   59 year old male comes in for 2 day history of atraumatic left knee pain. States returned from his normal 5 mile walk when he started feeling pain to the posterior knee.  No pain at rest, pain with range of motion and weightbearing.  Denies swelling, erythema, warmth.  Ibuprofen 200 mg without relief.     Past Medical History:  Diagnosis Date  . Testicular torsion     Patient Active Problem List   Diagnosis Date Noted  . BMI 36.0-36.9,adult 07/12/2019  . Hematochezia 07/12/2019  . Umbilical hernia 03/29/1600  . Bilateral hearing loss 07/06/2018  . Healthcare maintenance 06/05/2018  . Elevated LDL cholesterol level 06/05/2018    Past Surgical History:  Procedure Laterality Date  . KNEE SURGERY    . testicular torsion repair         Home Medications    Prior to Admission medications   Medication Sig Start Date End Date Taking? Authorizing Provider  Cholecalciferol (VITAMIN D PO) Take 1 tablet by mouth daily.    [provider]  meloxicam (MOBIC) 7.5 MG tablet Take 1 tablet (7.5 mg total) by mouth daily. 08/06/20   Tasia Catchings, Einer Meals V, PA-C  Multiple Vitamin (MULTIVITAMIN WITH MINERALS) TABS tablet Take 1 tablet by mouth daily.    [provider]  Omega-3 Fatty Acids (FISH OIL PO) Take 1 tablet by mouth daily.    [provider]    Family History Family History  Problem Relation Age of Onset  . Diabetes Father   . Healthy Sister   . Diabetes Brother   . Healthy Son   . Healthy Brother   . Healthy Son     Social History Social History   Tobacco Use  . Smoking status: Never Smoker  . Smokeless tobacco: Never Used  Vaping Use  . Vaping Use: Never used  Substance Use Topics  . Alcohol use: Yes    Alcohol/week: 1.0 standard  drink    Types: 1 Cans of beer per week  . Drug use: No     Allergies   Patient has no known allergies.   Review of Systems Review of Systems  Reason unable to perform ROS: See HPI as above.     Physical Exam Triage Vital Signs ED Triage Vitals [08/06/20 1351]  Enc Vitals Group     BP 136/72     Pulse Rate 66     Resp 18     Temp 98.1 F (36.7 C)     Temp Source Oral     SpO2 97 %     Weight      Height      Head Circumference      Peak Flow      Pain Score 5     Pain Loc      Pain Edu?      Excl. in [place]?    No data found.  Updated Vital Signs BP 136/72 (BP Location: Left Arm)   Pulse 66   Temp 98.1 F (36.7 C) (Oral)   Resp 18   SpO2 97%   Physical Exam Constitutional:      General: He is not in acute distress.    Appearance: Normal appearance.  He is well-developed. He is not toxic-appearing or diaphoretic.  HENT:     Head: Normocephalic and atraumatic.  Eyes:     Conjunctiva/sclera: Conjunctivae normal.     Pupils: Pupils are equal, round, and reactive to light.  Pulmonary:     Effort: Pulmonary effort is normal. No respiratory distress.  Musculoskeletal:     Cervical back: Normal range of motion and neck supple.     Comments: No swelling, erythema, warmth, contusion.  No tenderness to palpation of the knee.  Full range of motion.  Strength 5/5. Sensation intact  Skin:    General: Skin is warm and dry.  Neurological:     Mental Status: He is alert and oriented to person, place, and time.      UC Treatments / Results  Labs (all labs ordered are listed, but only abnormal results are displayed) Labs Reviewed - No data to display  EKG   Radiology No results found.  Procedures Procedures (including critical care time)  Medications Ordered in UC Medications - No data to display  Initial Impression / Assessment and Plan / UC Course  I have reviewed the triage vital signs and the nursing notes.  Pertinent labs & imaging results that  were available during my care of the patient were reviewed by me and considered in my medical decision making (see chart for details).    Atraumatic left knee pain, felt mostly to the posterior knee.  No tenderness to palpation.  No calf swelling/leg swelling.  No erythema, warmth, low suspicion for DVT.  NSAIDs, ice compress, elevation, rest.  Return precautions given.  Final Clinical Impressions(s) / UC Diagnoses   Final diagnoses:  Acute pain of left knee    ED Prescriptions    Medication Sig Dispense Auth. Provider   meloxicam (MOBIC) 7.5 MG tablet Take 1 tablet (7.5 mg total) by mouth daily. 10 tablet Ok Edwards, PA-C     PDMP not reviewed this encounter.   Ok Edwards, PA-C 08/06/20 1423

## 2020-08-06 NOTE — Discharge Instructions (Signed)
Start Mobic. Do not take ibuprofen (motrin/advil)/ naproxen (aleve) while on mobic. Ice compress, rest, can get knee brace if needed. Follow up with PCP/orthopedics if symptoms not improving.

## 2020-08-06 NOTE — ED Triage Notes (Signed)
Pt here for left sided pain in back of knee that started yesterday while doing normal 5 mile walk; denies obvious injury; no swelling noted to LE

## 2020-08-09 DIAGNOSIS — G8929 Other chronic pain: Secondary | ICD-10-CM | POA: Insufficient documentation

## 2020-08-09 DIAGNOSIS — Z96652 Presence of left artificial knee joint: Secondary | ICD-10-CM | POA: Insufficient documentation

## 2020-08-11 DIAGNOSIS — M25562 Pain in left knee: Secondary | ICD-10-CM | POA: Diagnosis not present

## 2020-09-15 ENCOUNTER — Ambulatory Visit: Payer: Federal, State, Local not specified - PPO | Attending: Internal Medicine

## 2020-09-15 DIAGNOSIS — Z23 Encounter for immunization: Secondary | ICD-10-CM

## 2020-09-15 NOTE — Progress Notes (Signed)
   Covid-19 Vaccination Clinic  Name:  Wesley King    MRN: [id number] DOB: June 06, 1961  09/15/2020  [name] was observed post Covid-19 immunization for 15 minutes without incident. He was provided with Vaccine Information Sheet and instruction to access the V-Safe system.   [name] was instructed to call 911 with any severe reactions post vaccine: Marland Kitchen Difficulty breathing  . Swelling of face and throat  . A fast heartbeat  . A bad rash all over body  . Dizziness and weakness   Immunizations Administered    Name Date Dose VIS Date Route   Pfizer COVID-19 Vaccine 09/15/2020  4:39 PM 0.3 mL 07/19/2020 Intramuscular   Manufacturer: [place]   Lot: [id number]   [place]: [id number]

## 2020-10-27 DIAGNOSIS — M1712 Unilateral primary osteoarthritis, left knee: Secondary | ICD-10-CM | POA: Diagnosis not present

## 2020-11-24 ENCOUNTER — Ambulatory Visit (INDEPENDENT_AMBULATORY_CARE_PROVIDER_SITE_OTHER): Payer: Federal, State, Local not specified - PPO | Admitting: Physician Assistant

## 2020-11-24 ENCOUNTER — Other Ambulatory Visit: Payer: Self-pay

## 2020-11-24 ENCOUNTER — Encounter: Payer: Self-pay | Admitting: Physician Assistant

## 2020-11-24 VITALS — BP 150/72 | HR 61 | Temp 99.0°F | Ht 73.5 in | Wt 266.4 lb

## 2020-11-24 DIAGNOSIS — E7800 Pure hypercholesterolemia, unspecified: Secondary | ICD-10-CM

## 2020-11-24 DIAGNOSIS — K429 Umbilical hernia without obstruction or gangrene: Secondary | ICD-10-CM

## 2020-11-24 DIAGNOSIS — Z1211 Encounter for screening for malignant neoplasm of colon: Secondary | ICD-10-CM

## 2020-11-24 DIAGNOSIS — Z1329 Encounter for screening for other suspected endocrine disorder: Secondary | ICD-10-CM

## 2020-11-24 DIAGNOSIS — Z0000 Encounter for general adult medical examination without abnormal findings: Secondary | ICD-10-CM

## 2020-11-24 DIAGNOSIS — Z125 Encounter for screening for malignant neoplasm of prostate: Secondary | ICD-10-CM | POA: Diagnosis not present

## 2020-11-24 NOTE — Addendum Note (Signed)
Addended by: Mickel Crow on: 11/24/2020 10:51 AM   Modules accepted: Orders

## 2020-11-24 NOTE — Progress Notes (Addendum)
Male physical   Impression and Recommendations:    1. Healthcare maintenance   2. Elevated LDL cholesterol level   3. Screening for prostate cancer   4. Screening for thyroid disorder   5. Umbilical hernia without obstruction and without gangrene      1) Anticipatory Guidance: Skin CA prevention- recommend to use sunscreen when outside along with skin surveillance; eating a balanced and modest diet; continue physical activity at least 25 minutes per day or minimum of 150 min/ week moderate to intense activity.  2) Immunizations / Screenings / Labs:   All immunizations are up-to-date per recommendations or will be updated today if pt allows.    - Patient understands with dental and vision screens they will schedule independently.  - Will obtain CBC, CMP, HgA1c, Lipid panel, TSH when fasting, if not already done past 12 mo/ recently. Will collect PSA after shared decision making. Will notify of lab results once available.  -UTD on Tdap, Hep C and HIV screenings, influenza and covid vaccines. Due for repeat colonoscopy. Will place order. -Will request records for Shingrix from [facility].  3) Weight: Continue to improve diet habits to improve overall feelings of well being and objective health data. Improve nutrient density of diet through increasing intake of fruits and vegetables and decreasing saturated fats, white flour products and refined sugars.   4) Healthcare Maintenance:  -Stay well hydrated. -Continue weight loss efforts.  -BP elevated today. Asymptomatic, reports working out before coming in for his appointment. Previous BP readings <130/80.  -Follow up with Orthopedics for left knee/leg pain. -Continue to monitor umbilical hernia. Per patient, has remained stable. No changes. -On exam mild thyromegaly appreciated. Will collect thyroid panel and pending results start appropriate treatment if indicated. Discussed with patient obtaining imaging studies if gland  enlarges or becomes symptomatic. Patient verbalized understanding. -Follow up in 1 year for CPE and FBW or sooner if needed.  Orders Placed This Encounter  Procedures  . CBC  . Comprehensive metabolic panel    Order Specific Question:   Has the patient fasted?    Answer:   Yes  . TSH  . Lipid panel    Order Specific Question:   Has the patient fasted?    Answer:   Yes  . Hemoglobin A1c  . PSA  . T4, free  . T3    No orders of the defined types were placed in this encounter.    Return in about 1 year (around 11/24/2021) for CPE and FBW.   Gross side effects, risk and benefits, and alternatives of medications discussed with patient.  Patient is aware that all medications have potential side effects and we are unable to predict every side effect or drug-drug interaction that may occur.  Expresses verbal understanding and consents to current therapy plan and treatment regimen.  Please see AVS handed out to patient at the end of our visit for further patient instructions/ counseling done pertaining to today's office visit.       Subjective:        CC: CPE   HPI: Wesley King is a 60 y.o. male who presents to [place] [facility] today for a yearly health maintenance exam.     Health Maintenance Summary  - Reviewed and updated, unless pt declines services.  Last Cologuard or Colonoscopy:   04/12/2010- repeat in 10 years. Will place new referral. Tobacco History Reviewed: Y, never a smoker  Abdominal Ultrasound: N/A  CT scan for screening lung CA:  N/A Alcohol / drug use:    No concerns, no excessive use / no use Exercise Habits: Moderate-rigorous physical activity  Dental Home: Y  Eye exams:Y Male history: STD concerns:   none Additional penile/ urinary concerns: none    Immunization History  Administered Date(s) Administered  . Influenza,inj,Quad PF,6+ Mos 07/06/2018, 07/12/2019  . Influenza,inj,quad, With Preservative 06/30/2020  .  PFIZER(Purple Top)SARS-COV-2 Vaccination 12/09/2019, 01/03/2020, 09/15/2020  . Tdap 07/06/2018     Health Maintenance  Topic Date Due  . COLONOSCOPY (Pts 45-65yrs Insurance coverage will need to be confirmed)  04/12/2020  . TETANUS/TDAP  07/06/2028  . INFLUENZA VACCINE  Completed  . COVID-19 Vaccine  Completed  . Hepatitis C Screening  Completed  . HIV Screening  Completed       Wt Readings from Last 3 Encounters:  11/24/20 266 lb 6.4 oz (120.8 kg)  07/12/19 277 lb 1.6 oz (125.7 kg)  07/06/18 268 lb 4.8 oz (121.7 kg)   BP Readings from Last 3 Encounters:  11/24/20 (!) 150/72  08/06/20 136/72  07/12/19 123/72   Pulse Readings from Last 3 Encounters:  11/24/20 61  08/06/20 66  07/12/19 (!) 59    Patient Active Problem List   Diagnosis Date Noted  . Osteoarthritis of left knee 10/27/2020  . Chronic knee pain after total replacement of left knee joint 08/09/2020  . BMI 36.0-36.9,adult 07/12/2019  . Hematochezia 07/12/2019  . Umbilical hernia 16/07/9603  . Bilateral hearing loss 07/06/2018  . Healthcare maintenance 06/05/2018  . Elevated LDL cholesterol level 06/05/2018    Past Medical History:  Diagnosis Date  . Testicular torsion     Past Surgical History:  Procedure Laterality Date  . KNEE SURGERY    . testicular torsion repair      Family History  Problem Relation Age of Onset  . Diabetes Father   . Healthy Sister   . Diabetes Brother   . Healthy Son   . Healthy Brother   . Healthy Son     Social History   Substance and Sexual Activity  Drug Use No  ,  Social History   Substance and Sexual Activity  Alcohol Use Yes  . Alcohol/week: 1.0 standard drink  . Types: 1 Cans of beer per week  ,  Social History   Tobacco Use  Smoking Status Never Smoker  Smokeless Tobacco Never Used  ,  Social History   Substance and Sexual Activity  Sexual Activity Yes  . Birth control/protection: Surgical    Patient's Medications  New Prescriptions    No medications on file  Previous Medications   CHOLECALCIFEROL (VITAMIN D PO)    Take 1 tablet by mouth daily.   MELOXICAM (MOBIC) 7.5 MG TABLET    Take 1 tablet (7.5 mg total) by mouth daily.   MULTIPLE VITAMIN (MULTIVITAMIN WITH MINERALS) TABS TABLET    Take 1 tablet by mouth daily.   OMEGA-3 FATTY ACIDS (FISH OIL PO)    Take 1 tablet by mouth daily.  Modified Medications   No medications on file  Discontinued Medications   No medications on file    Patient has no known allergies.  Review of Systems: General:   Denies fever, chills, unexplained weight loss.  Optho/Auditory:   Denies visual changes, blurred vision/LOV Respiratory:   Denies SOB, DOE more than baseline levels.   Cardiovascular:   Denies chest pain, palpitations, new onset peripheral edema  Gastrointestinal:   Denies nausea, vomiting, diarrhea.  Genitourinary: Denies dysuria, freq/ urgency, flank pain  Endocrine:     Denies hot or cold intolerance, polyuria, polydipsia. Musculoskeletal:   Denies unexplained myalgias, joint swelling, unexplained arthralgias, gait problems.  Skin:  Denies rash, suspicious lesions Neurological:     Denies dizziness, unexplained weakness, numbness  Psychiatric/Behavioral:   Denies mood changes, suicidal or homicidal ideations, hallucinations    Objective:     Blood pressure (!) 150/72, pulse 61, temperature 99 F (37.2 C), height 6' 1.5" (1.867 m), weight 266 lb 6.4 oz (120.8 kg), SpO2 97 %. Body mass index is 34.67 kg/m. General Appearance:    Alert, cooperative, no distress, appears stated age  Head:    Normocephalic, without obvious abnormality, atraumatic  Eyes:    PERRL, conjunctiva/corneas clear, EOM's intact, fundi    benign, both eyes  Ears:    Normal TM's and external ear canals, both ears  Nose:   Nares normal, septum midline, mucosa normal, no drainage    or sinus tenderness  Throat:   Lips w/o lesion, mucosa moist, and tongue normal; teeth and gums normal  Neck:    Supple, symmetrical, trachea midline, no adenopathy;    thyroid:  Enlarged right thyroid gland appreciated when compared to left, no tenderness/nodules; no JVD  Back:     Symmetric, no curvature, ROM normal, no CVA tenderness  Lungs:     Clear to auscultation bilaterally, respirations unlabored, no Wh/ R/ R  Chest Wall:    No tenderness or gross deformity; normal excursion   Heart:    Regular rate and rhythm, S1 and S2 normal, no murmur, rub   or gallop  Abdomen:     Soft, non-tender, bowel sounds active all four quadrants, No G/R/R, no masses, no organomegaly, umbilical hernia easily reducible   Genitalia:   Deferred. No concerns/sxs.  Rectal:   Deferred. Collecting PSA.  Extremities:   Extremities normal, atraumatic, no cyanosis or gross edema  Pulses:   2+ and symmetric all extremities  Skin:   Warm, dry, Skin color, texture, turgor normal, no obvious rashes or lesions  M-Sk:   Ambulates * 4 w/o difficulty, no gross deformities, tone WNL  Neurologic:   CNII-XII grossly intact, normal strength, sensation and reflexes    Throughout Psych:  No HI/SI, judgement and insight good, Euthymic mood. Full Affect.

## 2020-11-24 NOTE — Patient Instructions (Signed)

## 2020-11-25 LAB — LIPID PANEL
Chol/HDL Ratio: 3.3 ratio (ref 0.0–5.0)
Cholesterol, Total: 195 mg/dL (ref 100–199)
HDL: 60 mg/dL (ref >39)
LDL Chol Calc (NIH): 122 mg/dL — ABNORMAL HIGH (ref 0–99)
Triglycerides: 73 mg/dL (ref 0–149)
VLDL Cholesterol Cal: 13 mg/dL (ref 5–40)

## 2020-11-25 LAB — COMPREHENSIVE METABOLIC PANEL
ALT: 20 IU/L (ref 0–44)
AST: 30 IU/L (ref 0–40)
Albumin/Globulin Ratio: 1.5 (ref 1.2–2.2)
Albumin: 4.1 g/dL (ref 3.8–4.9)
Alkaline Phosphatase: 66 IU/L (ref 44–121)
BUN/Creatinine Ratio: 13 (ref 9–20)
BUN: 13 mg/dL (ref 6–24)
Bilirubin Total: 0.6 mg/dL (ref 0.0–1.2)
CO2: 22 mmol/L (ref 20–29)
Calcium: 9.6 mg/dL (ref 8.7–10.2)
Chloride: 105 mmol/L (ref 96–106)
Creatinine, Ser: 0.97 mg/dL (ref 0.76–1.27)
GFR calc Af Amer: 98 mL/min/{1.73_m2} (ref >59)
GFR calc non Af Amer: 85 mL/min/{1.73_m2} (ref >59)
Globulin, Total: 2.7 g/dL (ref 1.5–4.5)
Glucose: 101 mg/dL — ABNORMAL HIGH (ref 65–99)
Potassium: 4.4 mmol/L (ref 3.5–5.2)
Sodium: 141 mmol/L (ref 134–144)
Total Protein: 6.8 g/dL (ref 6.0–8.5)

## 2020-11-25 LAB — T3: T3, Total: 119 ng/dL (ref 71–180)

## 2020-11-25 LAB — CBC
Hematocrit: 40 % (ref 37.5–51.0)
Hemoglobin: 13.3 g/dL (ref 13.0–17.7)
MCH: 27.5 pg (ref 26.6–33.0)
MCHC: 33.3 g/dL (ref 31.5–35.7)
MCV: 83 fL (ref 79–97)
Platelets: 250 10*3/uL (ref 150–450)
RBC: 4.83 x10E6/uL (ref 4.14–5.80)
RDW: 12.2 % (ref 11.6–15.4)
WBC: 6 10*3/uL (ref 3.4–10.8)

## 2020-11-25 LAB — TSH: TSH: 0.929 u[IU]/mL (ref 0.450–4.500)

## 2020-11-25 LAB — PSA: Prostate Specific Ag, Serum: 1.1 ng/mL (ref 0.0–4.0)

## 2020-11-25 LAB — T4, FREE: Free T4: 1.05 ng/dL (ref 0.82–1.77)

## 2020-11-25 LAB — HEMOGLOBIN A1C
Est. average glucose Bld gHb Est-mCnc: 103 mg/dL
Hgb A1c MFr Bld: 5.2 % (ref 4.8–5.6)

## 2020-12-11 DIAGNOSIS — M2241 Chondromalacia patellae, right knee: Secondary | ICD-10-CM | POA: Diagnosis not present

## 2020-12-11 DIAGNOSIS — S83242A Other tear of medial meniscus, current injury, left knee, initial encounter: Secondary | ICD-10-CM | POA: Diagnosis not present

## 2020-12-14 DIAGNOSIS — M25562 Pain in left knee: Secondary | ICD-10-CM | POA: Diagnosis not present

## 2020-12-18 DIAGNOSIS — S83242A Other tear of medial meniscus, current injury, left knee, initial encounter: Secondary | ICD-10-CM | POA: Diagnosis not present

## 2021-02-10 ENCOUNTER — Other Ambulatory Visit: Payer: Self-pay

## 2021-02-10 ENCOUNTER — Encounter: Payer: Self-pay | Admitting: Emergency Medicine

## 2021-02-10 ENCOUNTER — Ambulatory Visit
Admission: EM | Admit: 2021-02-10 | Discharge: 2021-02-10 | Disposition: A | Discharge status: Home / Self Care | Payer: Federal, State, Local not specified - PPO | Attending: Emergency Medicine | Admitting: Emergency Medicine

## 2021-02-10 DIAGNOSIS — J069 Acute upper respiratory infection, unspecified: Secondary | ICD-10-CM | POA: Diagnosis not present

## 2021-02-10 DIAGNOSIS — Z1152 Encounter for screening for COVID-19: Secondary | ICD-10-CM | POA: Diagnosis not present

## 2021-02-10 MED ORDER — BENZONATATE 200 MG PO CAPS: 200.0000 mg | ORAL_CAPSULE | Freq: Three times a day (TID) | ORAL | 0 refills | Status: AC | PRN

## 2021-02-10 MED ORDER — CETIRIZINE HCL 10 MG PO CAPS: 10.0000 mg | ORAL_CAPSULE | Freq: Every day | ORAL | 0 refills | Status: DC

## 2021-02-10 MED ORDER — DM-GUAIFENESIN ER 30-600 MG PO TB12: 1.0000 | ORAL_TABLET | Freq: Two times a day (BID) | ORAL | 0 refills | Status: DC

## 2021-02-10 NOTE — ED Provider Notes (Signed)
[facility]    CSN: [id number] Arrival date & time: 02/10/21  0804      History   Chief Complaint Chief Complaint  Patient presents with  . Fatigue  . Cough  . Sore Throat    HPI Wesley King is a 60 y.o. male presenting today for evaluation of fatigue cough and sore throat.  Symptoms began approximately 3 days ago.  Reports associated fatigue and some mild body aches.  Has had slight hot and cold chills, but no known fevers.  Denies close sick contacts.  HPI  Past Medical History:  Diagnosis Date  . Testicular torsion     Patient Active Problem List   Diagnosis Date Noted  . Osteoarthritis of left knee 10/27/2020  . Chronic knee pain after total replacement of left knee joint 08/09/2020  . BMI 36.0-36.9,adult 07/12/2019  . Hematochezia 07/12/2019  . Umbilical hernia 42/35/3614  . Bilateral hearing loss 07/06/2018  . Healthcare maintenance 06/05/2018  . Elevated LDL cholesterol level 06/05/2018    Past Surgical History:  Procedure Laterality Date  . KNEE SURGERY    . testicular torsion repair         Home Medications    Prior to Admission medications   Medication Sig Start Date End Date Taking? Authorizing Provider  benzonatate (TESSALON) 200 MG capsule Take 1 capsule (200 mg total) by mouth 3 (three) times daily as needed for up to 7 days for cough. 02/10/21 02/17/21 Yes Adalberto Metzgar C, PA-C  Cetirizine HCl 10 MG CAPS Take 1 capsule (10 mg total) by mouth daily for 10 days. 02/10/21 02/20/21 Yes Aayliah Rotenberry C, PA-C  dextromethorphan-guaiFENesin (MUCINEX DM) 30-600 MG 12hr tablet Take 1 tablet by mouth 2 (two) times daily. 02/10/21  Yes Rudie Sermons C, PA-C  Cholecalciferol (VITAMIN D PO) Take 1 tablet by mouth daily.    [provider]  meloxicam (MOBIC) 7.5 MG tablet Take 1 tablet (7.5 mg total) by mouth daily. 08/06/20   Tasia Catchings, Amy V, PA-C  Multiple Vitamin (MULTIVITAMIN WITH MINERALS) TABS tablet Take 1 tablet by mouth daily.     [provider]  Omega-3 Fatty Acids (FISH OIL PO) Take 1 tablet by mouth daily.    [provider]    Family History Family History  Problem Relation Age of Onset  . Diabetes Father   . Healthy Sister   . Diabetes Brother   . Healthy Son   . Healthy Brother   . Healthy Son     Social History Social History   Tobacco Use  . Smoking status: Never Smoker  . Smokeless tobacco: Never Used  Vaping Use  . Vaping Use: Never used  Substance Use Topics  . Alcohol use: Yes    Alcohol/week: 1.0 standard drink    Types: 1 Cans of beer per week  . Drug use: No     Allergies   Patient has no known allergies.   Review of Systems Review of Systems  Constitutional: Positive for fatigue. Negative for activity change, appetite change, chills and fever.  HENT: Positive for sore throat. Negative for congestion, ear pain, rhinorrhea, sinus pressure and trouble swallowing.   Eyes: Negative for discharge and redness.  Respiratory: Positive for cough. Negative for chest tightness and shortness of breath.   Cardiovascular: Negative for chest pain.  Gastrointestinal: Negative for abdominal pain, diarrhea, nausea and vomiting.  Musculoskeletal: Negative for myalgias.  Skin: Negative for rash.  Neurological: Negative for dizziness, light-headedness and headaches.  Physical Exam Triage Vital Signs ED Triage Vitals [02/10/21 0814]  Enc Vitals Group     BP (!) 148/74     Pulse Rate 79     Resp 16     Temp 98.4 F (36.9 C)     Temp Source Oral     SpO2 94 %     Weight      Height      Head Circumference      Peak Flow      Pain Score      Pain Loc      Pain Edu?      Excl. in [place]?    No data found.  Updated Vital Signs BP (!) 148/74 (BP Location: Right Arm)   Pulse 79   Temp 98.4 F (36.9 C) (Oral)   Resp 16   SpO2 94%   Visual Acuity Right Eye Distance:   Left Eye Distance:   Bilateral Distance:    Right Eye Near:   Left Eye Near:     Bilateral Near:     Physical Exam Vitals and nursing note reviewed.  Constitutional:      Appearance: He is well-developed.     Comments: No acute distress  HENT:     Head: Normocephalic and atraumatic.     Nose: Nose normal.  Eyes:     Conjunctiva/sclera: Conjunctivae normal.  Cardiovascular:     Rate and Rhythm: Normal rate.  Pulmonary:     Effort: Pulmonary effort is normal. No respiratory distress.  Abdominal:     General: There is no distension.  Musculoskeletal:        General: Normal range of motion.     Cervical back: Neck supple.  Skin:    General: Skin is warm and dry.  Neurological:     Mental Status: He is alert and oriented to person, place, and time.      UC Treatments / Results  Labs (all labs ordered are listed, but only abnormal results are displayed) Labs Reviewed  NOVEL CORONAVIRUS, NAA    EKG   Radiology No results found.  Procedures Procedures (including critical care time)  Medications Ordered in UC Medications - No data to display  Initial Impression / Assessment and Plan / UC Course  I have reviewed the triage vital signs and the nursing notes.  Pertinent labs & imaging results that were available during my care of the patient were reviewed by me and considered in my medical decision making (see chart for details).     Viral URI with cough-COVID test pending, recommending symptomatic and supportive care, exam reassuring, vital signs stable, continue to monitor, recommendations provided.  Discussed strict return precautions. Patient verbalized understanding and is agreeable with plan.  Final Clinical Impressions(s) / UC Diagnoses   Final diagnoses:  Encounter for screening for COVID-19  Viral URI with cough     Discharge Instructions     COVID test pending, monitor MyChart for results May begin daily cetirizine/Zyrtec to help with postnasal drainage/throat irritation Tessalon every 8 hours for cough Mucinex DM twice  daily as needed for further cough and congestion relief May use other over-the-counter medicine as needed if preferred Rest and fluids Follow-up if not improving or worsening    ED Prescriptions    Medication Sig Dispense Auth. Provider   benzonatate (TESSALON) 200 MG capsule Take 1 capsule (200 mg total) by mouth 3 (three) times daily as needed for up to 7 days for cough. 28 capsule Carlo Guevarra,  Navjot Pilgrim C, PA-C   Cetirizine HCl 10 MG CAPS Take 1 capsule (10 mg total) by mouth daily for 10 days. 10 capsule  Paone C, PA-C   dextromethorphan-guaiFENesin (MUCINEX DM) 30-600 MG 12hr tablet Take 1 tablet by mouth 2 (two) times daily. 20 tablet Khiana Camino, [place] C, PA-C     PDMP not reviewed this encounter.   Janith Lima, PA-C 02/10/21 1039

## 2021-02-10 NOTE — ED Triage Notes (Signed)
Pt said since Thursday he has been having chills, fatigue, scratchy throat, phlegm down the back of his throat. No fevers,

## 2021-02-10 NOTE — Discharge Instructions (Addendum)
COVID test pending, monitor MyChart for results May begin daily cetirizine/Zyrtec to help with postnasal drainage/throat irritation Tessalon every 8 hours for cough Mucinex DM twice daily as needed for further cough and congestion relief May use other over-the-counter medicine as needed if preferred Rest and fluids Follow-up if not improving or worsening

## 2021-02-12 LAB — NOVEL CORONAVIRUS, NAA: SARS-CoV-2, NAA: DETECTED — AB

## 2021-02-12 LAB — SARS-COV-2, NAA 2 DAY TAT

## 2021-02-13 ENCOUNTER — Telehealth: Payer: Self-pay

## 2021-02-13 NOTE — Telephone Encounter (Signed)
Called to discuss with patient about COVID-19 symptoms and the use of one of the available treatments for those with mild to moderate Covid symptoms and at a high risk of hospitalization.  Pt may appear to qualify for outpatient treatment due to co-morbid conditions and/or a member of an at-risk group in accordance with the FDA Emergency Use Authorization.      Unable to reach pt - LMOM   Keola Heninger, RN   

## 2021-06-13 DIAGNOSIS — S83242D Other tear of medial meniscus, current injury, left knee, subsequent encounter: Secondary | ICD-10-CM | POA: Diagnosis not present

## 2021-11-08 DIAGNOSIS — Y999 Unspecified external cause status: Secondary | ICD-10-CM | POA: Diagnosis not present

## 2021-11-08 DIAGNOSIS — S83242A Other tear of medial meniscus, current injury, left knee, initial encounter: Secondary | ICD-10-CM | POA: Diagnosis not present

## 2021-11-08 DIAGNOSIS — S83232A Complex tear of medial meniscus, current injury, left knee, initial encounter: Secondary | ICD-10-CM | POA: Diagnosis not present

## 2021-11-08 DIAGNOSIS — M948X6 Other specified disorders of cartilage, lower leg: Secondary | ICD-10-CM | POA: Diagnosis not present

## 2021-11-08 DIAGNOSIS — X58XXXA Exposure to other specified factors, initial encounter: Secondary | ICD-10-CM | POA: Diagnosis not present

## 2021-11-26 ENCOUNTER — Encounter: Payer: Self-pay | Admitting: Physician Assistant

## 2021-11-26 ENCOUNTER — Ambulatory Visit (INDEPENDENT_AMBULATORY_CARE_PROVIDER_SITE_OTHER): Payer: Federal, State, Local not specified - PPO | Admitting: Physician Assistant

## 2021-11-26 ENCOUNTER — Other Ambulatory Visit: Payer: Self-pay

## 2021-11-26 VITALS — BP 128/70 | HR 71 | Temp 98.0°F | Ht 74.0 in | Wt 273.0 lb

## 2021-11-26 DIAGNOSIS — M79672 Pain in left foot: Secondary | ICD-10-CM

## 2021-11-26 DIAGNOSIS — Z23 Encounter for immunization: Secondary | ICD-10-CM

## 2021-11-26 DIAGNOSIS — E7800 Pure hypercholesterolemia, unspecified: Secondary | ICD-10-CM | POA: Diagnosis not present

## 2021-11-26 DIAGNOSIS — Z0000 Encounter for general adult medical examination without abnormal findings: Secondary | ICD-10-CM

## 2021-11-26 DIAGNOSIS — K429 Umbilical hernia without obstruction or gangrene: Secondary | ICD-10-CM

## 2021-11-26 DIAGNOSIS — Z1329 Encounter for screening for other suspected endocrine disorder: Secondary | ICD-10-CM

## 2021-11-26 DIAGNOSIS — M79671 Pain in right foot: Secondary | ICD-10-CM

## 2021-11-26 DIAGNOSIS — Z125 Encounter for screening for malignant neoplasm of prostate: Secondary | ICD-10-CM

## 2021-11-26 DIAGNOSIS — Z1211 Encounter for screening for malignant neoplasm of colon: Secondary | ICD-10-CM

## 2021-11-26 NOTE — Progress Notes (Signed)
Complete physical exam   Patient: Wesley King   DOB: 11-12-1960   61 y.o. Male  MRN: [id number] Visit Date: 11/26/2021   Chief Complaint  Patient presents with   Annual Exam   Subjective    Wesley King is a 61 y.o. male who presents today for a complete physical exam.  He reports consuming a general diet. Gym/ health club routine includes cardio and high impact aerobics . He generally feels fairly well. He does have additional problems to discuss today Pt c/o chronic bilateral foot pain, states pain is constant and localized on plantar surface. Has tried good support shoes and inserts which have been ineffective. Has seen Orthopedics in the past.     Past Medical History:  Diagnosis Date   Testicular torsion    Past Surgical History:  Procedure Laterality Date   KNEE SURGERY     testicular torsion repair     Social History   Socioeconomic History   Marital status: Married    Spouse name: Not on file   Number of children: Not on file   Years of education: Not on file   Highest education level: Not on file  Occupational History   Not on file  Tobacco Use   Smoking status: Never   Smokeless tobacco: Never  Vaping Use   Vaping Use: Never used  Substance and Sexual Activity   Alcohol use: Yes    Alcohol/week: 1.0 standard drink    Types: 1 Cans of beer per week   Drug use: No   Sexual activity: Yes    Birth control/protection: Surgical  Other Topics Concern   Not on file  Social History Narrative   Not on file   Social Determinants of Health   Financial Resource Strain: Not on file  Food Insecurity: Not on file  Transportation Needs: Not on file  Physical Activity: Not on file  Stress: Not on file  Social Connections: Not on file  Intimate Partner Violence: Not on file     Medications: Outpatient Medications Prior to Visit  Medication Sig   Cholecalciferol (VITAMIN D PO) Take 1 tablet by mouth daily.   dextromethorphan-guaiFENesin  (MUCINEX DM) 30-600 MG 12hr tablet Take 1 tablet by mouth 2 (two) times daily.   meloxicam (MOBIC) 7.5 MG tablet Take 1 tablet (7.5 mg total) by mouth daily.   Multiple Vitamin (MULTIVITAMIN WITH MINERALS) TABS tablet Take 1 tablet by mouth daily.   Omega-3 Fatty Acids (FISH OIL PO) Take 1 tablet by mouth daily.   Cetirizine HCl 10 MG CAPS Take 1 capsule (10 mg total) by mouth daily for 10 days.   Facility-Administered Medications Prior to Visit  Medication Dose Route Frequency Provider   nitroGLYCERIN (NITROSTAT) SL tablet 0.3 mg  0.3 mg Sublingual Q5 min PRN Roselee Culver, MD    Review of Systems Review of Systems:  A fourteen system review of systems was performed and found to be positive as per HPI.    Objective    BP 128/70    Pulse 71    Temp 98 F (36.7 C)    Ht 6\' 2"  (1.88 m)    Wt 273 lb (123.8 kg)    SpO2 97%    BMI 35.05 kg/m    Physical Exam   General Appearance:    Alert, cooperative, in no acute distress, appears stated age  Head:    Normocephalic, without obvious abnormality, atraumatic  Eyes:    PERRL, conjunctiva/corneas clear, EOM's  intact, fundi    benign, both eyes       Ears:    Normal TM's and external ear canals, both ears  Nose:   Nares normal, septum midline, mucosa normal, no drainage   or sinus tenderness  Throat:   Lips, mucosa, and tongue normal; teeth and gums normal  Neck:   Supple, symmetrical, trachea midline, no adenopathy;       thyroid:  No/tenderness/nodules, +mild enlargement ; no JVD  Back:     Symmetric, no curvature, ROM normal, no CVA tenderness  Lungs:     Clear to auscultation bilaterally, respirations unlabored  Chest wall:    No tenderness or deformity  Heart:    Normal heart rate. Normal rhythm. No murmurs, rubs, or gallops.  S1 and S2 normal  Abdomen:     Soft, non-tender, bowel sounds active all four quadrants, no organomegaly, stable umbilical hernia   Genitalia:    deferred  Rectal:    deferred  Extremities:   All  extremities are intact. No cyanosis or edema  Pulses:   2+ and symmetric all extremities  Skin:   Skin color, texture, turgor normal, no rashes or lesions  Lymph nodes:   Cervical and supraclavicular nodes normal  Neurologic:   CNII-XII grossly intact.     Last depression screening scores PHQ 2/9 Scores 11/26/2021 11/24/2020 07/12/2019  PHQ - 2 Score 0 0 0  PHQ- 9 Score 0 0 0   Last fall risk screening Fall Risk  11/26/2021  Falls in the past year? 0  Number falls in past yr: 0  Injury with Fall? 0  Risk for fall due to : No Fall Risks  Follow up Falls evaluation completed     No results found for any visits on 11/26/21.  Assessment & Plan    Routine Health Maintenance and Physical Exam  Exercise Activities and Dietary recommendations -Discussed heart healthy diet low in fat and carbohydrates. Continue moderate exercise 150 mins/wk.  Immunization History  Administered Date(s) Administered   Influenza,inj,Quad PF,6+ Mos 07/06/2018, 07/12/2019   Influenza,inj,quad, With Preservative 06/30/2020   PFIZER(Purple Top)SARS-COV-2 Vaccination 12/09/2019, 01/03/2020, 09/15/2020   Tdap 07/06/2018    Health Maintenance  Topic Date Due   Zoster Vaccines- Shingrix (1 of 2) Never done   COLONOSCOPY (Pts 45-16yrs Insurance coverage will need to be confirmed)  04/12/2020   COVID-19 Vaccine (4 - Booster for Pfizer series) 11/10/2020   INFLUENZA VACCINE  04/30/2021   TETANUS/TDAP  07/06/2028   Hepatitis C Screening  Completed   HIV Screening  Completed   HPV VACCINES  Aged Out    Discussed health benefits of physical activity, and encouraged him to engage in regular exercise appropriate for his age and condition.  Problem List Items Addressed This Visit       Other   Healthcare maintenance - Primary   Relevant Orders   PSA   CBC   Comprehensive metabolic panel   TSH   Lipid panel   Hemoglobin A1c   Elevated LDL cholesterol level   Relevant Orders   PSA   CBC    Comprehensive metabolic panel   TSH   Lipid panel   Hemoglobin O2U   Umbilical hernia   Other Visit Diagnoses     Screening for thyroid disorder       Relevant Orders   PSA   CBC   Comprehensive metabolic panel   TSH   Lipid panel   Hemoglobin A1c   Screening for colon  cancer       Relevant Orders   Cologuard   Screening for prostate cancer       Relevant Orders   PSA   Need for shingles vaccine       Relevant Orders   Varicella-zoster vaccine IM   Need for influenza vaccination       Relevant Orders   Flu Vaccine QUAD 6+ mos PF IM (Fluarix Quad PF)   Bilateral foot pain       Relevant Orders   Ambulatory referral to Podiatry      -Will obtain routine fasting labs. Pt agreeable to second Shingrix, influenza vaccine and Cologuard.  -Will place podiatry referral. -Umbilical hernia stable, will continue to monitor.  Return in about 1 year (around 11/26/2022) for CPE with labs few days before.       Lorrene Reid, PA-C  [facility] at [facility] [phone] (phone) [phone] (fax)  [place]

## 2021-11-26 NOTE — Patient Instructions (Signed)

## 2021-11-27 LAB — COMPREHENSIVE METABOLIC PANEL
ALT: 18 IU/L (ref 0–44)
AST: 33 IU/L (ref 0–40)
Albumin/Globulin Ratio: 1.4 (ref 1.2–2.2)
Albumin: 4.2 g/dL (ref 3.8–4.9)
Alkaline Phosphatase: 74 IU/L (ref 44–121)
BUN/Creatinine Ratio: 15 (ref 10–24)
BUN: 16 mg/dL (ref 8–27)
Bilirubin Total: 0.7 mg/dL (ref 0.0–1.2)
CO2: 22 mmol/L (ref 20–29)
Calcium: 9.5 mg/dL (ref 8.6–10.2)
Chloride: 105 mmol/L (ref 96–106)
Creatinine, Ser: 1.04 mg/dL (ref 0.76–1.27)
Globulin, Total: 3 g/dL (ref 1.5–4.5)
Glucose: 117 mg/dL — ABNORMAL HIGH (ref 70–99)
Potassium: 4.6 mmol/L (ref 3.5–5.2)
Sodium: 141 mmol/L (ref 134–144)
Total Protein: 7.2 g/dL (ref 6.0–8.5)
eGFR: 82 mL/min/{1.73_m2} (ref >59)

## 2021-11-27 LAB — CBC
Hematocrit: 37.7 % (ref 37.5–51.0)
Hemoglobin: 12.6 g/dL — ABNORMAL LOW (ref 13.0–17.7)
MCH: 27.3 pg (ref 26.6–33.0)
MCHC: 33.4 g/dL (ref 31.5–35.7)
MCV: 82 fL (ref 79–97)
Platelets: 251 10*3/uL (ref 150–450)
RBC: 4.62 x10E6/uL (ref 4.14–5.80)
RDW: 12.3 % (ref 11.6–15.4)
WBC: 4.7 10*3/uL (ref 3.4–10.8)

## 2021-11-27 LAB — LIPID PANEL
Chol/HDL Ratio: 3.8 ratio (ref 0.0–5.0)
Cholesterol, Total: 197 mg/dL (ref 100–199)
HDL: 52 mg/dL (ref >39)
LDL Chol Calc (NIH): 134 mg/dL — ABNORMAL HIGH (ref 0–99)
Triglycerides: 59 mg/dL (ref 0–149)
VLDL Cholesterol Cal: 11 mg/dL (ref 5–40)

## 2021-11-27 LAB — HEMOGLOBIN A1C
Est. average glucose Bld gHb Est-mCnc: 103 mg/dL
Hgb A1c MFr Bld: 5.2 % (ref 4.8–5.6)

## 2021-11-27 LAB — TSH: TSH: 0.977 u[IU]/mL (ref 0.450–4.500)

## 2021-11-27 LAB — PSA: Prostate Specific Ag, Serum: 1.2 ng/mL (ref 0.0–4.0)

## 2021-11-28 ENCOUNTER — Other Ambulatory Visit: Payer: Federal, State, Local not specified - PPO

## 2021-11-30 ENCOUNTER — Encounter: Payer: Federal, State, Local not specified - PPO | Admitting: Physician Assistant

## 2021-12-03 ENCOUNTER — Ambulatory Visit: Payer: Federal, State, Local not specified - PPO

## 2021-12-03 ENCOUNTER — Ambulatory Visit (INDEPENDENT_AMBULATORY_CARE_PROVIDER_SITE_OTHER): Payer: Federal, State, Local not specified - PPO | Admitting: Podiatry

## 2021-12-03 ENCOUNTER — Other Ambulatory Visit: Payer: Self-pay

## 2021-12-03 ENCOUNTER — Ambulatory Visit (INDEPENDENT_AMBULATORY_CARE_PROVIDER_SITE_OTHER): Payer: Federal, State, Local not specified - PPO

## 2021-12-03 DIAGNOSIS — M79671 Pain in right foot: Secondary | ICD-10-CM | POA: Diagnosis not present

## 2021-12-03 DIAGNOSIS — M722 Plantar fascial fibromatosis: Secondary | ICD-10-CM

## 2021-12-03 DIAGNOSIS — M79672 Pain in left foot: Secondary | ICD-10-CM

## 2021-12-03 MED ORDER — MELOXICAM 15 MG PO TABS: 15.0000 mg | ORAL_TABLET | Freq: Every day | ORAL | 0 refills | Status: AC | PRN

## 2021-12-03 NOTE — Progress Notes (Signed)
Patient seen for evaluation for foot orthotics. Discussed financial obligation and patient would like a pre-cert done before casting proceeds. Precertification to be submitted to insurance and patient to be contacted when we have an out of pocket estimate ?

## 2021-12-03 NOTE — Patient Instructions (Addendum)
You an massage in "Voltaren gel" into the bumps on the bottoms of the feet.   If was nice to meet you today. If you have any questions or any further concerns, please feel fee to give me a call. You can call our office at [phone] or please feel fee to send me a message through [facility].     Plantar Fasciitis (Heel Spur Syndrome) with Rehab The plantar fascia is a fibrous, ligament-like, soft-tissue structure that spans the bottom of the foot. Plantar fasciitis is a condition that causes pain in the foot due to inflammation of the tissue. SYMPTOMS  Pain and tenderness on the underneath side of the foot. Pain that worsens with standing or walking. CAUSES  Plantar fasciitis is caused by irritation and injury to the plantar fascia on the underneath side of the foot. Common mechanisms of injury include: Direct trauma to bottom of the foot. Damage to a small nerve that runs under the foot where the main fascia attaches to the heel bone. Stress placed on the plantar fascia due to bone spurs. RISK INCREASES WITH:  Activities that place stress on the plantar fascia (running, jumping, pivoting, or cutting). Poor strength and flexibility. Improperly fitted shoes. Tight calf muscles. Flat feet. Failure to warm-up properly before activity. Obesity. PREVENTION Warm up and stretch properly before activity. Allow for adequate recovery between workouts. Maintain physical fitness: Strength, flexibility, and endurance. Cardiovascular fitness. Maintain a health body weight. Avoid stress on the plantar fascia. Wear properly fitted shoes, including arch supports for individuals who have flat feet.  PROGNOSIS  If treated properly, then the symptoms of plantar fasciitis usually resolve without surgery. However, occasionally surgery is necessary.  RELATED COMPLICATIONS  Recurrent symptoms that may result in a chronic condition. Problems of the lower back that are caused by compensating for the  injury, such as limping. Pain or weakness of the foot during push-off following surgery. Chronic inflammation, scarring, and partial or complete fascia tear, occurring more often from repeated injections.  TREATMENT  Treatment initially involves the use of ice and medication to help reduce pain and inflammation. The use of strengthening and stretching exercises may help reduce pain with activity, especially stretches of the Achilles tendon. These exercises may be performed at home or with a therapist. Your caregiver may recommend that you use heel cups of arch supports to help reduce stress on the plantar fascia. Occasionally, corticosteroid injections are given to reduce inflammation. If symptoms persist for greater than 6 months despite non-surgical (conservative), then surgery may be recommended.   MEDICATION  If pain medication is necessary, then nonsteroidal anti-inflammatory medications, such as aspirin and ibuprofen, or other minor pain relievers, such as acetaminophen, are often recommended. Do not take pain medication within 7 days before surgery. Prescription pain relievers may be given if deemed necessary by your caregiver. Use only as directed and only as much as you need. Corticosteroid injections may be given by your caregiver. These injections should be reserved for the most serious cases, because they may only be given a certain number of times.  HEAT AND COLD Cold treatment (icing) relieves pain and reduces inflammation. Cold treatment should be applied for 10 to 15 minutes every 2 to 3 hours for inflammation and pain and immediately after any activity that aggravates your symptoms. Use ice packs or massage the area with a piece of ice (ice massage). Heat treatment may be used prior to performing the stretching and strengthening activities prescribed by your caregiver, physical therapist, or  Product/process development scientist. Use a heat pack or soak the injury in warm water.  SEEK IMMEDIATE MEDICAL  CARE IF: Treatment seems to offer no benefit, or the condition worsens. Any medications produce adverse side effects.  EXERCISES- RANGE OF MOTION (ROM) AND STRETCHING EXERCISES - Plantar Fasciitis (Heel Spur Syndrome) These exercises may help you when beginning to rehabilitate your injury. Your symptoms may resolve with or without further involvement from your physician, physical therapist or athletic trainer. While completing these exercises, remember:  Restoring tissue flexibility helps normal motion to return to the joints. This allows healthier, less painful movement and activity. An effective stretch should be held for at least 30 seconds. A stretch should never be painful. You should only feel a gentle lengthening or release in the stretched tissue.  RANGE OF MOTION - Toe Extension, Flexion Sit with your right / left leg crossed over your opposite knee. Grasp your toes and gently pull them back toward the top of your foot. You should feel a stretch on the bottom of your toes and/or foot. Hold this stretch for 10 seconds. Now, gently pull your toes toward the bottom of your foot. You should feel a stretch on the top of your toes and or foot. Hold this stretch for 10 seconds. Repeat  times. Complete this stretch 3 times per day.   RANGE OF MOTION - Ankle Dorsiflexion, Active Assisted Remove shoes and sit on a chair that is preferably not on a carpeted surface. Place right / left foot under knee. Extend your opposite leg for support. Keeping your heel down, slide your right / left foot back toward the chair until you feel a stretch at your ankle or calf. If you do not feel a stretch, slide your bottom forward to the edge of the chair, while still keeping your heel down. Hold this stretch for 10 seconds. Repeat 3 times. Complete this stretch 2 times per day.   STRETCH  Gastroc, Standing Place hands on wall. Extend right / left leg, keeping the front knee somewhat bent. Slightly point  your toes inward on your back foot. Keeping your right / left heel on the floor and your knee straight, shift your weight toward the wall, not allowing your back to arch. You should feel a gentle stretch in the right / left calf. Hold this position for 10 seconds. Repeat 3 times. Complete this stretch 2 times per day.  STRETCH  Soleus, Standing Place hands on wall. Extend right / left leg, keeping the other knee somewhat bent. Slightly point your toes inward on your back foot. Keep your right / left heel on the floor, bend your back knee, and slightly shift your weight over the back leg so that you feel a gentle stretch deep in your back calf. Hold this position for 10 seconds. Repeat 3 times. Complete this stretch 2 times per day.  STRETCH  Gastrocsoleus, Standing  Note: This exercise can place a lot of stress on your foot and ankle. Please complete this exercise only if specifically instructed by your caregiver.  Place the ball of your right / left foot on a step, keeping your other foot firmly on the same step. Hold on to the wall or a rail for balance. Slowly lift your other foot, allowing your body weight to press your heel down over the edge of the step. You should feel a stretch in your right / left calf. Hold this position for 10 seconds. Repeat this exercise with a slight bend in  your right / left knee. Repeat 3 times. Complete this stretch 2 times per day.   STRENGTHENING EXERCISES - Plantar Fasciitis (Heel Spur Syndrome)  These exercises may help you when beginning to rehabilitate your injury. They may resolve your symptoms with or without further involvement from your physician, physical therapist or athletic trainer. While completing these exercises, remember:  Muscles can gain both the endurance and the strength needed for everyday activities through controlled exercises. Complete these exercises as instructed by your physician, physical therapist or athletic trainer. Progress  the resistance and repetitions only as guided.  STRENGTH - Towel Curls Sit in a chair positioned on a non-carpeted surface. Place your foot on a towel, keeping your heel on the floor. Pull the towel toward your heel by only curling your toes. Keep your heel on the floor. Repeat 3 times. Complete this exercise 2 times per day.  STRENGTH - Ankle Inversion Secure one end of a rubber exercise band/tubing to a fixed object (table, pole). Loop the other end around your foot just before your toes. Place your fists between your knees. This will focus your strengthening at your ankle. Slowly, pull your big toe up and in, making sure the band/tubing is positioned to resist the entire motion. Hold this position for 10 seconds. Have your muscles resist the band/tubing as it slowly pulls your foot back to the starting position. Repeat 3 times. Complete this exercises 2 times per day.  Document Released: 09/16/2005 Document Revised: 12/09/2011 Document Reviewed: 12/29/2008 [facility] Patient Information 2014 [place], [place].

## 2021-12-06 NOTE — Progress Notes (Signed)
Subjective:  ? ?Patient ID: Wesley King, male   DOB: 61 y.o.   MRN: [id number]  ? ?HPI ?61 year old male presents the office today with concerns of bilateral foot pain.  He said this for about 15 years ago and he describes throbbing discomfort in the bottom of his feet.  He has tried over-the-counter orthotics.  He [place] 8 hours [place] which aggravates his symptoms.  He believes that his foot discomfort is due to "wear and tear".  No injuries that he reports.  He has no other concerns today. ? ? ?Review of Systems  ?All other systems reviewed and are negative. ? ?Past Medical History:  ?Diagnosis Date  ? Testicular torsion   ? ? ?Past Surgical History:  ?Procedure Laterality Date  ? KNEE SURGERY    ? testicular torsion repair    ? ? ? ?Current Outpatient Medications:  ?  meloxicam (MOBIC) 15 MG tablet, Take 1 tablet (15 mg total) by mouth daily as needed for pain., Disp: 30 tablet, Rfl: 0 ?  Cetirizine HCl 10 MG CAPS, Take 1 capsule (10 mg total) by mouth daily for 10 days., Disp: 10 capsule, Rfl: 0 ?  Cholecalciferol (VITAMIN D PO), Take 1 tablet by mouth daily., Disp: , Rfl:  ?  dextromethorphan-guaiFENesin (MUCINEX DM) 30-600 MG 12hr tablet, Take 1 tablet by mouth 2 (two) times daily., Disp: 20 tablet, Rfl: 0 ?  meloxicam (MOBIC) 7.5 MG tablet, Take 1 tablet (7.5 mg total) by mouth daily., Disp: 10 tablet, Rfl: 0 ?  Multiple Vitamin (MULTIVITAMIN WITH MINERALS) TABS tablet, Take 1 tablet by mouth daily., Disp: , Rfl:  ?  Omega-3 Fatty Acids (FISH OIL PO), Take 1 tablet by mouth daily., Disp: , Rfl:  ? ?Current Facility-Administered Medications:  ?  nitroGLYCERIN (NITROSTAT) SL tablet 0.3 mg, 0.3 mg, Sublingual, Q5 min PRN, Roselee Culver, MD ? ?No Known Allergies ? ? ? ? ?   ?Objective:  ?Physical Exam  ?General: AAO x3, NAD ? ?Dermatological: Skin is warm, dry and supple bilateral. There are no open sores, no preulcerative lesions, no rash or signs of infection  present. ? ?Vascular: Dorsalis Pedis artery and Posterior Tibial artery pedal pulses are 2/4 bilateral with immedate capillary fill time.  There is no pain with calf compression, swelling, warmth, erythema.  ? ?Neruologic: Grossly intact via light touch bilateral.  Negative Tinel sign. ? ?Musculoskeletal: There is a decreased medial arch upon weightbearing bilaterally.  There are small plantar fibromas present on the medial band of plantar fascia within the arch of the foot was causing some mild discomfort.  There is also mild discomfort noted on the plantar aspect the calcaneus on the insertion of the plantar fascia along the arch of the foot as well.  There is no area of pinpoint tenderness.  Ankle range of motion intact.  Flexor, extensor tendons appear to be intact as well.  Muscular strength 5/5 in all groups tested bilateral. ? ?Gait: Unassisted, Nonantalgic.  ? ? ?   ?Assessment:  ? ?Plantar fasciitis, plantar fibroma ? ?   ?Plan:  ?-Treatment options discussed including all alternatives, risks, and complications ?-Etiology of symptoms were discussed ?-X-rays were obtained and reviewed with the patient.  No evidence of acute fracture or stress fracture calcifications are noted. ?-Prescribed mobic. Discussed side effects of the medication and directed to stop if any are to occur and call the office.  ?-Discussed stretching, icing daily.  Discussed shoe modifications and orthotics.  As he is attempted over-the-counter inserts without significant improvement I would have him follow-up with our orthotist, [name] for this.  This can also help with offloading of the fibromas. ?-In regards to the fibromas discussed stretching, icing as well as offloading.  If needed consider steroid injection. ? ?[name] DPM ? ?   ? ?

## 2021-12-17 DIAGNOSIS — S83242D Other tear of medial meniscus, current injury, left knee, subsequent encounter: Secondary | ICD-10-CM | POA: Diagnosis not present

## 2021-12-28 ENCOUNTER — Other Ambulatory Visit: Payer: Self-pay | Admitting: Podiatry

## 2021-12-28 DIAGNOSIS — M722 Plantar fascial fibromatosis: Secondary | ICD-10-CM

## 2022-01-11 LAB — COLOGUARD: COLOGUARD: POSITIVE — AB

## 2022-01-15 ENCOUNTER — Other Ambulatory Visit: Payer: Self-pay

## 2022-01-15 DIAGNOSIS — R195 Other fecal abnormalities: Secondary | ICD-10-CM

## 2022-01-16 ENCOUNTER — Ambulatory Visit: Payer: Federal, State, Local not specified - PPO | Admitting: Physician Assistant

## 2022-01-25 ENCOUNTER — Encounter: Payer: Self-pay | Admitting: Gastroenterology

## 2022-02-06 ENCOUNTER — Ambulatory Visit (AMBULATORY_SURGERY_CENTER): Payer: Self-pay | Admitting: *Deleted

## 2022-02-06 VITALS — Ht 73.0 in | Wt 271.0 lb

## 2022-02-06 DIAGNOSIS — R195 Other fecal abnormalities: Secondary | ICD-10-CM

## 2022-02-06 MED ORDER — PLENVU 140 G PO SOLR: 1.0000 | ORAL | 0 refills | Status: DC

## 2022-02-06 NOTE — Progress Notes (Signed)
No egg or soy allergy known to patient  ?No issues known to pt with past sedation with any surgeries or procedures ?Patient denies ever being told they had issues or difficulty with intubation  ?No FH of Malignant Hyperthermia ?Pt is not on diet pills ?Pt is not on  home 02  ?Pt is not on blood thinners  ?Pt denies issues with constipation  ?No A fib or A flutter ? ?Plenvu Coupon to pt in PV today , Code to Pharmacy and  NO PA's for preps discussed with pt In PV today  ?Discussed with pt there will be an out-of-pocket cost for prep and that varies from $0 to 70 +  dollars - pt verbalized understanding  ? ? ?PV completed in person. Pt verified name, DOB, address and insurance during PV today.  ?Pt given instruction packet with copy of consent form to read and sign.  ?Pt encouraged to call with questions or issues.  ? ?Insurance confirmed with pt at [facility] today   ?

## 2022-03-06 ENCOUNTER — Ambulatory Visit (AMBULATORY_SURGERY_CENTER): Payer: Federal, State, Local not specified - PPO | Admitting: Gastroenterology

## 2022-03-06 ENCOUNTER — Encounter: Payer: Self-pay | Admitting: Gastroenterology

## 2022-03-06 VITALS — BP 119/68 | HR 64 | Temp 98.6°F | Resp 16 | Ht 74.0 in | Wt 271.0 lb

## 2022-03-06 DIAGNOSIS — K648 Other hemorrhoids: Secondary | ICD-10-CM

## 2022-03-06 DIAGNOSIS — Z1211 Encounter for screening for malignant neoplasm of colon: Secondary | ICD-10-CM | POA: Diagnosis not present

## 2022-03-06 DIAGNOSIS — R195 Other fecal abnormalities: Secondary | ICD-10-CM | POA: Diagnosis not present

## 2022-03-06 DIAGNOSIS — D122 Benign neoplasm of ascending colon: Secondary | ICD-10-CM

## 2022-03-06 DIAGNOSIS — K635 Polyp of colon: Secondary | ICD-10-CM | POA: Diagnosis not present

## 2022-03-06 MED ORDER — SODIUM CHLORIDE 0.9 % IV SOLN: 500.0000 mL | Freq: Once | INTRAVENOUS | Status: DC

## 2022-03-06 NOTE — Progress Notes (Signed)
Called to room to assist during endoscopic procedure.  Patient ID and intended procedure confirmed with present staff. Received instructions for my participation in the procedure from the performing physician.  

## 2022-03-06 NOTE — Progress Notes (Signed)
VSS, transported to PACU °

## 2022-03-06 NOTE — Patient Instructions (Signed)
Await pathology  Please read over handouts about polyps and hemorrhoids  Continue your normal medications   YOU HAD AN ENDOSCOPIC PROCEDURE TODAY AT [facility]:   Refer to the procedure report that was given to you for any specific questions about what was found during the examination.  If the procedure report does not answer your questions, please call your gastroenterologist to clarify.  If you requested that your care partner not be given the details of your procedure findings, then the procedure report has been included in a sealed envelope for you to review at your convenience later.  YOU SHOULD EXPECT: Some feelings of bloating in the abdomen. Passage of more gas than usual.  Walking can help get rid of the air that was put into your GI tract during the procedure and reduce the bloating. If you had a lower endoscopy (such as a colonoscopy or flexible sigmoidoscopy) you may notice spotting of blood in your stool or on the toilet paper. If you underwent a bowel prep for your procedure, you may not have a normal bowel movement for a few days.  Please Note:  You might notice some irritation and congestion in your nose or some drainage.  This is from the oxygen used during your procedure.  There is no need for concern and it should clear up in a day or so.  SYMPTOMS TO REPORT IMMEDIATELY:  Following lower endoscopy (colonoscopy or flexible sigmoidoscopy):  Excessive amounts of blood in the stool  Significant tenderness or worsening of abdominal pains  Swelling of the abdomen that is new, acute  Fever of 100F or higher For urgent or emergent issues, a gastroenterologist can be reached at any hour by calling [phone]. Do not use MyChart messaging for urgent concerns.    DIET:  We do recommend a small meal at first, but then you may proceed to your regular diet.  Drink plenty of fluids but you should avoid alcoholic beverages for 24 hours.  ACTIVITY:  You should plan  to take it easy for the rest of today and you should NOT DRIVE or use heavy machinery until tomorrow (because of the sedation medicines used during the test).    FOLLOW UP: Our staff will call the number listed on your records 24-72 hours following your procedure to check on you and address any questions or concerns that you may have regarding the information given to you following your procedure. If we do not reach you, we will leave a message.  We will attempt to reach you two times.  During this call, we will ask if you have developed any symptoms of COVID 19. If you develop any symptoms (ie: fever, flu-like symptoms, shortness of breath, cough etc.) before then, please call [phone].  If you test positive for Covid 19 in the 2 weeks post procedure, please call and report this information to [place].    If any biopsies were taken you will be contacted by phone or by letter within the next 1-3 weeks.  Please call us at [phone] if you have not heard about the biopsies in 3 weeks.    SIGNATURES/CONFIDENTIALITY: You and/or your care partner have signed paperwork which will be entered into your electronic medical record.  These signatures attest to the fact that that the information above on your After Visit Summary has been reviewed and is understood.  Full responsibility of the confidentiality of this discharge information lies with you and/or your care-partner.

## 2022-03-06 NOTE — Progress Notes (Signed)
History and Physical:  This patient presents for endoscopic testing for: Encounter Diagnosis  Name Primary?   Positive colorectal cancer screening using Cologuard test Yes    Reports colonoscopy about 10 yrs ago. Patient denies chronic abdominal pain, rectal bleeding, constipation or diarrhea.   Patient is otherwise without complaints or active issues today.   Past Medical History: Past Medical History:  Diagnosis Date   Hyperlipidemia    Testicular torsion      Past Surgical History: Past Surgical History:  Procedure Laterality Date   KNEE SURGERY     testicular torsion repair      Allergies: No Known Allergies  Outpatient Meds: Current Outpatient Medications  Medication Sig Dispense Refill   Cetirizine HCl 10 MG CAPS Take 1 capsule (10 mg total) by mouth daily for 10 days. 10 capsule 0   Cholecalciferol (VITAMIN D PO) Take 1 tablet by mouth daily.     dextromethorphan-guaiFENesin (MUCINEX DM) 30-600 MG 12hr tablet Take 1 tablet by mouth 2 (two) times daily. (Patient not taking: Reported on 02/06/2022) 20 tablet 0   meloxicam (MOBIC) 15 MG tablet Take 1 tablet (15 mg total) by mouth daily as needed for pain. (Patient not taking: Reported on 02/06/2022) 30 tablet 0   meloxicam (MOBIC) 7.5 MG tablet Take 1 tablet (7.5 mg total) by mouth daily. (Patient not taking: Reported on 02/06/2022) 10 tablet 0   Multiple Vitamin (MULTIVITAMIN WITH MINERALS) TABS tablet Take 1 tablet by mouth daily.     Omega-3 Fatty Acids (FISH OIL PO) Take 1 tablet by mouth daily.     Zinc Sulfate (ZINC 15 PO) Take by mouth.     Current Facility-Administered Medications  Medication Dose Route Frequency Provider Last Rate Last Admin   0.9 %  sodium chloride infusion  500 mL Intravenous Once Danis, Estill Cotta III, MD          ___________________________________________________________________ Objective   Exam:  BP 135/68   Pulse (!) 58   Temp 98.6 F (37 C) (Skin)   Ht '6\' 2"'$  (1.88 m)   Wt  271 lb (122.9 kg)   SpO2 97%   BMI 34.79 kg/m   CV: RRR without murmur, S1/S2 Resp: clear to auscultation bilaterally, normal RR and effort noted GI: soft, no tenderness, with active bowel sounds.   Assessment: Encounter Diagnosis  Name Primary?   Positive colorectal cancer screening using Cologuard test Yes     Plan: Colonoscopy  The benefits and risks of the planned procedure were described in detail with the patient or (when appropriate) their health care proxy.  Risks were outlined as including, but not limited to, bleeding, infection, perforation, adverse medication reaction leading to cardiac or pulmonary decompensation, pancreatitis (if ERCP).  The limitation of incomplete mucosal visualization was also discussed.  No guarantees or warranties were given.    The patient is appropriate for an endoscopic procedure in the ambulatory setting.   - Wilfrid Lund, MD

## 2022-03-06 NOTE — Progress Notes (Signed)
Pt's states no medical or surgical changes since previsit or office visit. 

## 2022-03-06 NOTE — Op Note (Signed)
[facility] Patient Name: Wesley King Procedure Date: 03/06/2022 8:25 AM MRN: [id number] Endoscopist: Mallie Mussel L. Loletha Carrow , MD Age: 61 Referring MD:  Date of Birth: 18-Dec-1960 Gender: Male Account #: [id number] Procedure:                Colonoscopy Indications:              Positive Cologuard test Medicines:                Monitored Anesthesia Care Procedure:                Pre-Anesthesia Assessment:                           - Prior to the procedure, a History and Physical                            was performed, and patient medications and                            allergies were reviewed. The patient's tolerance of                            previous anesthesia was also reviewed. The risks                            and benefits of the procedure and the sedation                            options and risks were discussed with the patient.                            All questions were answered, and informed consent                            was obtained. Prior Anticoagulants: The patient has                            taken no previous anticoagulant or antiplatelet                            agents. ASA Grade Assessment: II - A patient with                            mild systemic disease. After reviewing the risks                            and benefits, the patient was deemed in                            satisfactory condition to undergo the procedure.                           After obtaining informed consent, the colonoscope  was passed under direct vision. Throughout the                            procedure, the patient's blood pressure, pulse, and                            oxygen saturations were monitored continuously. The                            CF [id number] #[id number] was introduced through the anus                            and advanced to the the cecum, identified by                            appendiceal orifice and ileocecal valve.  The                            colonoscopy was performed without difficulty. The                            patient tolerated the procedure well. The quality                            of the bowel preparation was excellent. The                            ileocecal valve, appendiceal orifice, and rectum                            were photographed. Scope In: 8:36:15 AM Scope Out: 8:51:43 AM Scope Withdrawal Time: 0 hours 13 minutes 5 seconds  Total Procedure Duration: 0 hours 15 minutes 28 seconds  Findings:                 The perianal and digital rectal examinations were                            normal.                           Three semi-sessile polyps were found in the                            ascending colon. The polyps were diminutive in                            size. These polyps were removed with a cold snare.                            Resection was complete, but the polyp tissue was                            only partially retrieved.  There was a lipoma, in the ascending colon.                           Repeat examination of right colon under NBI                            performed.                           Internal hemorrhoids were found.                           The exam was otherwise without abnormality on                            direct and retroflexion views. Complications:            No immediate complications. Estimated Blood Loss:     Estimated blood loss was minimal. Impression:               - Three diminutive polyps in the ascending colon,                            removed with a cold snare. Complete resection.                            Partial retrieval.                           - Lipoma in the ascending colon.                           - Internal hemorrhoids.                           - The examination was otherwise normal on direct                            and retroflexion views. Recommendation:           - Patient has  a contact number available for                            emergencies. The signs and symptoms of potential                            delayed complications were discussed with the                            patient. Return to normal activities tomorrow.                            Written discharge instructions were provided to the                            patient.                           -  Resume previous diet.                           - Continue present medications.                           - Await pathology results.                           - Repeat colonoscopy is recommended for                            surveillance. The colonoscopy date will be                            determined after pathology results from today's                            exam become available for review. Lamara Brecht L. Loletha Carrow, MD 03/06/2022 8:55:42 AM This report has been signed electronically.

## 2022-03-07 ENCOUNTER — Telehealth: Payer: Self-pay

## 2022-03-07 NOTE — Telephone Encounter (Signed)
Left message on answering machine. 

## 2022-03-13 ENCOUNTER — Encounter: Payer: Self-pay | Admitting: Gastroenterology

## 2022-06-04 ENCOUNTER — Ambulatory Visit (INDEPENDENT_AMBULATORY_CARE_PROVIDER_SITE_OTHER): Payer: Federal, State, Local not specified - PPO | Admitting: Nurse Practitioner

## 2022-06-04 ENCOUNTER — Encounter: Payer: Self-pay | Admitting: Nurse Practitioner

## 2022-06-04 VITALS — BP 134/81 | HR 73 | Ht 74.0 in | Wt 253.0 lb

## 2022-06-04 DIAGNOSIS — R0602 Shortness of breath: Secondary | ICD-10-CM | POA: Diagnosis not present

## 2022-06-04 DIAGNOSIS — R42 Dizziness and giddiness: Secondary | ICD-10-CM

## 2022-06-04 DIAGNOSIS — E860 Dehydration: Secondary | ICD-10-CM | POA: Insufficient documentation

## 2022-06-04 DIAGNOSIS — R252 Cramp and spasm: Secondary | ICD-10-CM

## 2022-06-04 DIAGNOSIS — R5383 Other fatigue: Secondary | ICD-10-CM

## 2022-06-04 DIAGNOSIS — R0609 Other forms of dyspnea: Secondary | ICD-10-CM | POA: Insufficient documentation

## 2022-06-04 NOTE — Progress Notes (Signed)
Reviewed with patient during visit

## 2022-06-04 NOTE — Progress Notes (Signed)
Established patient visit   Patient: Wesley King   DOB: 1960-11-25   61 y.o. Male  MRN: [id number] Visit Date: 06/04/2022  Chief Complaint  Patient presents with   Dizziness   Subjective    Dizziness Associated symptoms include fatigue and myalgias. Pertinent negatives include no chest pain, chills, congestion, coughing, fever, headaches, nausea, rash, sore throat, vomiting or weakness.    Acute visit  -dizziness and shortness of breath, intermittent for two weeks.  -very physically active. Suddenly, he feels like he can't keep up. Feels like he is " out of gas."  -gong up the stairs causes him shortness of breath. This has been going on for some time . -patient does not smoke, he was never a smoker. -no history f asthma  -cannot think of anything he is doing differently in past two weeks .   Medications: Outpatient Medications Prior to Visit  Medication Sig   Cholecalciferol (VITAMIN D PO) Take 1 tablet by mouth daily.   Multiple Vitamin (MULTIVITAMIN WITH MINERALS) TABS tablet Take 1 tablet by mouth daily.   Omega-3 Fatty Acids (FISH OIL PO) Take 1 tablet by mouth daily.   Zinc Sulfate (ZINC 15 PO) Take by mouth.   Cetirizine HCl 10 MG CAPS Take 1 capsule (10 mg total) by mouth daily for 10 days.   dextromethorphan-guaiFENesin (MUCINEX DM) 30-600 MG 12hr tablet Take 1 tablet by mouth 2 (two) times daily. (Patient not taking: Reported on 02/06/2022)   meloxicam (MOBIC) 15 MG tablet Take 1 tablet (15 mg total) by mouth daily as needed for pain. (Patient not taking: Reported on 02/06/2022)   meloxicam (MOBIC) 7.5 MG tablet Take 1 tablet (7.5 mg total) by mouth daily. (Patient not taking: Reported on 02/06/2022)   No facility-administered medications prior to visit.    Review of Systems  Constitutional:  Positive for activity change and fatigue. Negative for chills and fever.       Feels shortness of breath with any exertion.  - patient has had 20 pound weight loss since his  last visit in 10/2021.   HENT:  Negative for congestion, postnasal drip, rhinorrhea, sinus pressure, sinus pain, sneezing and sore throat.   Eyes: Negative.   Respiratory:  Positive for shortness of breath. Negative for cough and wheezing.   Cardiovascular:  Negative for chest pain and palpitations.  Gastrointestinal:  Negative for constipation, diarrhea, nausea and vomiting.  Endocrine: Negative for cold intolerance, heat intolerance, polydipsia and polyuria.  Genitourinary:  Negative for dysuria, frequency and urgency.  Musculoskeletal:  Positive for myalgias. Negative for back pain.  Skin:  Negative for rash.  Allergic/Immunologic: Negative for environmental allergies.  Neurological:  Positive for dizziness. Negative for weakness and headaches.  Psychiatric/Behavioral:  The patient is not nervous/anxious.      Objective     Today's Vitals   06/04/22 0926  BP: 134/81  Pulse: 73  SpO2: 97%  Weight: 253 lb (114.8 kg)  Height: 6' 2"  (1.88 m)   Body mass index is 32.48 kg/m.     Physical Exam Vitals and nursing note reviewed.  Constitutional:      Appearance: Normal appearance. He is well-developed.  HENT:     Head: Normocephalic and atraumatic.     Nose: Nose normal.     Mouth/Throat:     Mouth: Mucous membranes are moist.     Pharynx: Oropharynx is clear.  Eyes:     Extraocular Movements: Extraocular movements intact.     Conjunctiva/sclera: Conjunctivae normal.  Pupils: Pupils are equal, round, and reactive to light.  Neck:     Vascular: No carotid bruit.  Cardiovascular:     Rate and Rhythm: Normal rate and regular rhythm.     Pulses: Normal pulses.     Heart sounds: Normal heart sounds.     Comments: EKG showing sinus bradycardia and is otherwise normal. Pulmonary:     Effort: Pulmonary effort is normal.     Breath sounds: Normal breath sounds.  Abdominal:     Palpations: Abdomen is soft.  Musculoskeletal:        General: Normal range of motion.      Cervical back: Normal range of motion and neck supple.  Lymphadenopathy:     Cervical: No cervical adenopathy.  Skin:    General: Skin is warm and dry.     Capillary Refill: Capillary refill takes less than 2 seconds.  Neurological:     General: No focal deficit present.     Mental Status: He is alert and oriented to person, place, and time.  Psychiatric:        Mood and Affect: Mood normal.        Behavior: Behavior normal.        Thought Content: Thought content normal.        Judgment: Judgment normal.     Assessment & Plan     1. Dizziness ECG showing sinus bradycardia without other abnormalities. Check labs. If all normal, consider referral to cardiology  - CBC - Comp Met (CMET) - TSH + free T4  2. Shortness of breath This is especially severe with exertion. ECG shows sinus bradycardia without other abnormalities. Will check labs. If normal, will refer to cardiology for further evaluation.  - CBC - Comp Met (CMET) - B12 and Folate Panel - Ferritin - TSH + free T4  3. Other fatigue Check labs for further evaluation.  - CBC - Comp Met (CMET) - B12 and Folate Panel - Ferritin - VITAMIN D 25 Hydroxy (Vit-D Deficiency, Fractures) - TSH + free T4  4. Muscle cramps Check labs  - Comp Met (CMET) - Magnesium - Phosphorus  5. Dehydration Check labs for further evaluation.  - Comp Met (CMET)   Return for prn worsening or persistent symptoms.        Ronnell Freshwater, NP  [facility] [phone] (phone) [phone] (fax)  [place]

## 2022-06-05 ENCOUNTER — Other Ambulatory Visit: Payer: Self-pay | Admitting: Nurse Practitioner

## 2022-06-05 DIAGNOSIS — R42 Dizziness and giddiness: Secondary | ICD-10-CM

## 2022-06-05 DIAGNOSIS — R0602 Shortness of breath: Secondary | ICD-10-CM

## 2022-06-05 LAB — COMPREHENSIVE METABOLIC PANEL
ALT: 16 IU/L (ref 0–44)
AST: 21 IU/L (ref 0–40)
Albumin/Globulin Ratio: 1.3 (ref 1.2–2.2)
Albumin: 4.2 g/dL (ref 3.8–4.9)
Alkaline Phosphatase: 86 IU/L (ref 44–121)
BUN/Creatinine Ratio: 11 (ref 10–24)
BUN: 11 mg/dL (ref 8–27)
Bilirubin Total: 0.7 mg/dL (ref 0.0–1.2)
CO2: 23 mmol/L (ref 20–29)
Calcium: 9.9 mg/dL (ref 8.6–10.2)
Chloride: 105 mmol/L (ref 96–106)
Creatinine, Ser: 1.03 mg/dL (ref 0.76–1.27)
Globulin, Total: 3.2 g/dL (ref 1.5–4.5)
Glucose: 101 mg/dL — ABNORMAL HIGH (ref 70–99)
Potassium: 4.5 mmol/L (ref 3.5–5.2)
Sodium: 141 mmol/L (ref 134–144)
Total Protein: 7.4 g/dL (ref 6.0–8.5)
eGFR: 83 mL/min/{1.73_m2} (ref >59)

## 2022-06-05 LAB — CBC
Hematocrit: 40.9 % (ref 37.5–51.0)
Hemoglobin: 13.5 g/dL (ref 13.0–17.7)
MCH: 27.3 pg (ref 26.6–33.0)
MCHC: 33 g/dL (ref 31.5–35.7)
MCV: 83 fL (ref 79–97)
Platelets: 247 10*3/uL (ref 150–450)
RBC: 4.95 x10E6/uL (ref 4.14–5.80)
RDW: 12.3 % (ref 11.6–15.4)
WBC: 5.5 10*3/uL (ref 3.4–10.8)

## 2022-06-05 LAB — TSH+FREE T4
Free T4: 1.16 ng/dL (ref 0.82–1.77)
TSH: 0.875 u[IU]/mL (ref 0.450–4.500)

## 2022-06-05 LAB — VITAMIN D 25 HYDROXY (VIT D DEFICIENCY, FRACTURES): Vit D, 25-Hydroxy: 35.1 ng/mL (ref 30.0–100.0)

## 2022-06-05 LAB — FERRITIN: Ferritin: 320 ng/mL (ref 30–400)

## 2022-06-05 LAB — PHOSPHORUS: Phosphorus: 2.7 mg/dL — ABNORMAL LOW (ref 2.8–4.1)

## 2022-06-05 LAB — B12 AND FOLATE PANEL
Folate: 10.3 ng/mL (ref >3.0)
Vitamin B-12: 1707 pg/mL — ABNORMAL HIGH (ref 232–1245)

## 2022-06-05 LAB — MAGNESIUM: Magnesium: 2.1 mg/dL (ref 1.6–2.3)

## 2022-06-05 NOTE — Progress Notes (Signed)
Referral placed to cardiology today due to symptoms of shortness of breath and near-syncopal episodes

## 2022-06-05 NOTE — Progress Notes (Signed)
Please let the patient know that labs are back and all are essentially normal. His phosphorus was just a little lower than it should be, but that should not cause the symptoms he is having. I have placed a referral to cardiology for further evaluation of shortness of breath with exertion and dizziness.  Thanks so much.   -HB

## 2022-06-25 ENCOUNTER — Ambulatory Visit: Payer: Federal, State, Local not specified - PPO | Attending: Cardiovascular Disease | Admitting: Cardiovascular Disease

## 2022-06-25 ENCOUNTER — Encounter: Payer: Self-pay | Admitting: Cardiovascular Disease

## 2022-06-25 ENCOUNTER — Ambulatory Visit: Payer: Federal, State, Local not specified - PPO | Attending: Cardiovascular Disease

## 2022-06-25 DIAGNOSIS — E7800 Pure hypercholesterolemia, unspecified: Secondary | ICD-10-CM

## 2022-06-25 DIAGNOSIS — R0609 Other forms of dyspnea: Secondary | ICD-10-CM

## 2022-06-25 DIAGNOSIS — R42 Dizziness and giddiness: Secondary | ICD-10-CM | POA: Diagnosis not present

## 2022-06-25 NOTE — Assessment & Plan Note (Signed)
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  He works out twice a day in his home gym and rides his bicycle 20 to 30 miles a day.  In the last several months he is noted some dyspnea on exertion with maximum workout but really denies chest pain.  I am going to get a 2D echocardiogram and a coronary calcium score to further evaluate.

## 2022-06-25 NOTE — Progress Notes (Signed)
06/25/2022 Wesley King   19-Jan-1961  [id number]  Primary Physician Wesley Reid, PA-C Primary Cardiologist: Lorretta Harp MD Wesley King, [place]  HPI:  Wesley King is a 61 y.o. moderately overweight although fit appearing married African-American male father of 2 children who recently retired from the [employer] on 01/27/2022 after working for 30 years.  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  He has no cardiac risk factors other than mild elevation in LDL untreated.  He is very active and works out in [employer] twice a day, rides his bicycle 20 to 30 miles at a time.  He has never had a heart attack or stroke.  He is noticed increasing dyspnea with maximal exercise for last several months but denies chest pain.  He has had episodes of "nearly blacking out" for unclear reasons.  He is intentionally trying to lose weight as result of diet" eating well" has lost 20 pounds last several months.   Current Meds  Medication Sig   Cholecalciferol (VITAMIN D PO) Take 1 tablet by mouth daily.   dextromethorphan-guaiFENesin (MUCINEX DM) 30-600 MG 12hr tablet Take 1 tablet by mouth 2 (two) times daily.   meloxicam (MOBIC) 15 MG tablet Take 1 tablet (15 mg total) by mouth daily as needed for pain.   meloxicam (MOBIC) 7.5 MG tablet Take 1 tablet (7.5 mg total) by mouth daily.   Multiple Vitamin (MULTIVITAMIN WITH MINERALS) TABS tablet Take 1 tablet by mouth daily.   Omega-3 Fatty Acids (FISH OIL PO) Take 1 tablet by mouth daily.   Zinc Sulfate (ZINC 15 PO) Take by mouth.     No Known Allergies  Social History   Socioeconomic History   Marital status: Married    Spouse name: Not on file   Number of children: Not on file   Years of education: Not on file   Highest education level: Not on file  Occupational History   Not on file  Tobacco Use   Smoking status: Never   Smokeless tobacco: Never  Vaping Use   Vaping  Use: Never used  Substance and Sexual Activity   Alcohol use: Yes    Alcohol/week: 1.0 standard drink of alcohol    Types: 1 Cans of beer per week    Comment: occasional   Drug use: Yes    Types: Marijuana   Sexual activity: Yes    Birth control/protection: Surgical  Other Topics Concern   Not on file  Social History Narrative   Not on file   Social Determinants of Health   Financial Resource Strain: Not on file  Food Insecurity: Not on file  Transportation Needs: Not on file  Physical Activity: Not on file  Stress: Not on file  Social Connections: Not on file  Intimate Partner Violence: Not on file     Review of Systems: General: negative for chills, fever, night sweats or weight changes.  Cardiovascular: negative for chest pain, dyspnea on exertion, edema, orthopnea, palpitations, paroxysmal nocturnal dyspnea or shortness of breath Dermatological: negative for rash Respiratory: negative for cough or wheezing Urologic: negative for hematuria Abdominal: negative for nausea, vomiting, diarrhea, bright red blood per rectum, melena, or hematemesis Neurologic: negative for visual changes, syncope, or dizziness All other systems reviewed and are otherwise negative except as noted above.    Blood pressure 128/84, pulse (!) 56, height '6\' 2"'$  (1.88 m), weight 256  lb (116.1 kg).  General appearance: alert and no distress Neck: no adenopathy, no carotid bruit, no JVD, supple, symmetrical, trachea midline, and thyroid not enlarged, symmetric, no tenderness/mass/nodules Lungs: clear to auscultation bilaterally Heart: regular rate and rhythm, S1, S2 normal, no murmur, click, rub or gallop Extremities: extremities normal, atraumatic, no cyanosis or edema Pulses: 2+ and symmetric Skin: Skin color, texture, turgor normal. No rashes or lesions Neurologic: Grossly normal  EKG sinus bradycardia 56 with borderline voltage criteria for LVH.  Personally reviewed this EKG.  ASSESSMENT AND  PLAN:   Elevated LDL cholesterol level History of hyperlipidemia with recent lipid profile performed 11/26/2021 revealing total cholesterol 197, LDL 134 HDL 52.  I am going to get a coronary calcium score to further restratify.  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  He works out twice a day in his home gym and rides his bicycle 20 to 30 miles a day.  In the last several months he is noted some dyspnea on exertion with maximum workout but really denies chest pain.  I am going to get a 2D echocardiogram and a coronary calcium score to further evaluate.  Dizziness Patient complains of multiple episodes of dizziness/presyncope where he feels suddenly dizzy like he is going to "blackout".  This occurs both with change of position and otherwise.  He has lost 20 pounds over the last several months intentionally with diet" eating right".  I am going to put on a 2-week Zio patch to further evaluate     Lorretta Harp MD [facility], [facility] 06/25/2022 8:24 AM

## 2022-06-25 NOTE — Progress Notes (Unsigned)
Enrolled for [employer] to mail a ZIO XT long term holter monitor to the patients address on file.  

## 2022-06-25 NOTE — Assessment & Plan Note (Signed)
Patient complains of multiple episodes of dizziness/presyncope where he feels suddenly dizzy like he is going to "blackout".  This occurs both with change of position and otherwise.  He has lost 20 pounds over the last several months intentionally with diet" eating right".  I am going to put on a 2-week Zio patch to further evaluate

## 2022-06-25 NOTE — Patient Instructions (Signed)
Medication Instructions:  Your physician recommends that you continue on your current medications as directed. Please refer to the Current Medication list given to you today.  *If you need a refill on your cardiac medications before your next appointment, please call your pharmacy*   Testing/Procedures: Your physician has requested that you have an echocardiogram. Echocardiography is a painless test that uses sound waves to create images of your heart. It provides your doctor with information about the size and shape of your heart and how well your heart's chambers and valves are working. This procedure takes approximately one hour. There are no restrictions for this procedure. This procedure will be done at [facility], 2nd Floor   Dr. Gwenlyn Found has ordered a CT coronary calcium score.   Test locations:  [facility], 2nd Floor)  This is $99 out of pocket.   Coronary CalciumScan A coronary calcium scan is an imaging test used to look for deposits of calcium and other fatty materials (plaques) in the inner lining of the blood vessels of the heart (coronary arteries). These deposits of calcium and plaques can partly clog and narrow the coronary arteries without producing any symptoms or warning signs. This puts a person at risk for a heart attack. This test can detect these deposits before symptoms develop. Tell a health care provider about: Any allergies you have. All medicines you are taking, including vitamins, herbs, eye drops, creams, and over-the-counter medicines. Any problems you or family members have had with anesthetic medicines. Any blood disorders you have. Any surgeries you have had. Any medical conditions you have. Whether you are pregnant or may be pregnant. What are the risks? Generally, this is a safe procedure. However, problems may occur, including: Harm to a pregnant woman and her unborn baby. This test involves the use of  radiation. Radiation exposure can be dangerous to a pregnant woman and her unborn baby. If you are pregnant, you generally should not have this procedure done. Slight increase in the risk of cancer. This is because of the radiation involved in the test. What happens before the procedure? No preparation is needed for this procedure. What happens during the procedure? You will undress and remove any jewelry around your neck or chest. You will put on a hospital gown. Sticky electrodes will be placed on your chest. The electrodes will be connected to an electrocardiogram (ECG) machine to record a tracing of the electrical activity of your heart. A CT scanner will take pictures of your heart. During this time, you will be asked to lie still and hold your breath for 2-3 seconds while a picture of your heart is being taken. The procedure may vary among health care providers and hospitals. What happens after the procedure? You can get dressed. You can return to your normal activities. It is up to you to get the results of your test. Ask your health care provider, or the department that is doing the test, when your results will be ready. Summary A coronary calcium scan is an imaging test used to look for deposits of calcium and other fatty materials (plaques) in the inner lining of the blood vessels of the heart (coronary arteries). Generally, this is a safe procedure. Tell your health care provider if you are pregnant or may be pregnant. No preparation is needed for this procedure. A CT scanner will take pictures of your heart. You can return to your normal activities after the scan is done. This information is not  intended to replace advice given to you by your health care provider. Make sure you discuss any questions you have with your health care provider. Document Released: 03/14/2008 Document Revised: 08/05/2016 Document Reviewed: 08/05/2016 Elsevier Interactive Patient Education  2017 [place] Term Monitor Instructions  Your physician has requested you wear a ZIO patch monitor for 14 days.  This is a single patch monitor. [employer] supplies one patch monitor per enrollment. Additional stickers are not available. Please do not apply patch if you will be having a Nuclear Stress Test,  Echocardiogram, Cardiac CT, MRI, or Chest Xray during the period you would be wearing the  monitor. The patch cannot be worn during these tests. You cannot remove and re-apply the  ZIO XT patch monitor.  Your ZIO patch monitor will be mailed 3 day USPS to your address on file. It may take 3-5 days  to receive your monitor after you have been enrolled.  Once you have received your monitor, please review the enclosed instructions. Your monitor  has already been registered assigning a specific monitor serial # to you.  Billing and Patient Assistance Program Information  We have supplied [employer] with any of your insurance information on file for billing purposes. [employer] offers a sliding scale Patient Assistance Program for patients that do not have  insurance, or whose insurance does not completely cover the cost of the ZIO monitor.  You must apply for the Patient Assistance Program to qualify for this discounted rate.  To apply, please call [employer] at [phone], select option 4, select option 2, ask to apply for  Patient Assistance Program. Theodore Demark will ask your household income, and how many people  are in your household. They will quote your out-of-pocket cost based on that information.  [employer] will also be able to set up a 48-month interest-free payment plan if needed.  Applying the monitor   Shave hair from upper left chest.  Hold abrader disc by orange tab. Rub abrader in 40 strokes over the upper left chest as  indicated in your monitor instructions.  Clean area with 4 enclosed alcohol pads. Let dry.  Apply patch as indicated in monitor instructions. Patch will be  placed under collarbone on left  side of chest with arrow pointing upward.  Rub patch adhesive wings for 2 minutes. Remove white label marked "1". Remove the white  label marked "2". Rub patch adhesive wings for 2 additional minutes.  While looking in a mirror, press and release button in center of patch. A small green light will  flash 3-4 times. This will be your only indicator that the monitor has been turned on.  Do not shower for the first 24 hours. You may shower after the first 24 hours.  Press the button if you feel a symptom. You will hear a small click. Record Date, Time and  Symptom in the Patient Logbook.  When you are ready to remove the patch, follow instructions on the last 2 pages of Patient  Logbook. Stick patch monitor onto the last page of Patient Logbook.  Place Patient Logbook in the blue and white box. Use locking tab on box and tape box closed  securely. The blue and white box has prepaid postage on it. Please place it in the mailbox as  soon as possible. Your physician should have your test results approximately 7 days after the  monitor has been [facility]  Call [facility] [phone]  if you have questions regarding  your ZIO XT patch monitor. Call them immediately if you see an orange light blinking on your  monitor.  If your monitor falls off in less than 4 days, contact our Monitor department at [phone].  If your monitor becomes loose or falls off after 4 days call [employer] at [phone] for  suggestions on securing your monitor    Follow-Up: At [facility] you and your health needs are our priority.  As part of our continuing mission to provide you with exceptional heart care, we have created designated Provider Care Teams.  These Care Teams include your primary Cardiologist (physician) and Advanced Practice Providers (APPs -  Physician Assistants and Nurse Practitioners) who all work together to  provide you with the care you need, when you need it.  We recommend signing up for the patient portal called "MyChart".  Sign up information is provided on this After Visit Summary.  MyChart is used to connect with patients for Virtual Visits (Telemedicine).  Patients are able to view lab/test results, encounter notes, upcoming appointments, etc.  Non-urgent messages can be sent to your provider as well.   To learn more about what you can do with MyChart, go to [email or web address].    Your next appointment:   We will see you on an as needed basis.  Provider:   Quay Burow, MD

## 2022-06-25 NOTE — Assessment & Plan Note (Signed)
History of hyperlipidemia with recent lipid profile performed 11/26/2021 revealing total cholesterol 197, LDL 134 HDL 52.  I am going to get a coronary calcium score to further restratify.

## 2022-07-10 ENCOUNTER — Other Ambulatory Visit (HOSPITAL_BASED_OUTPATIENT_CLINIC_OR_DEPARTMENT_OTHER): Payer: Federal, State, Local not specified - PPO

## 2022-07-10 ENCOUNTER — Ambulatory Visit (HOSPITAL_COMMUNITY): Payer: Federal, State, Local not specified - PPO

## 2022-07-17 ENCOUNTER — Ambulatory Visit (HOSPITAL_COMMUNITY)
Admission: RE | Admit: 2022-07-17 | Discharge: 2022-07-17 | Disposition: A | Discharge status: Home / Self Care | Payer: Federal, State, Local not specified - PPO | Source: Ambulatory Visit | Attending: Cardiovascular Disease | Admitting: Cardiovascular Disease

## 2022-07-17 ENCOUNTER — Ambulatory Visit (INDEPENDENT_AMBULATORY_CARE_PROVIDER_SITE_OTHER): Payer: Federal, State, Local not specified - PPO

## 2022-07-17 DIAGNOSIS — E7800 Pure hypercholesterolemia, unspecified: Secondary | ICD-10-CM | POA: Diagnosis not present

## 2022-07-17 DIAGNOSIS — R42 Dizziness and giddiness: Secondary | ICD-10-CM | POA: Diagnosis not present

## 2022-07-17 DIAGNOSIS — R0609 Other forms of dyspnea: Secondary | ICD-10-CM

## 2022-07-17 LAB — ECHOCARDIOGRAM COMPLETE
Area-P 1/2: 3.6 cm2
Calc EF: 58.5 %
S' Lateral: 2.88 cm
Single Plane A2C EF: 57.3 %
Single Plane A4C EF: 57.9 %

## 2022-12-26 ENCOUNTER — Ambulatory Visit (INDEPENDENT_AMBULATORY_CARE_PROVIDER_SITE_OTHER): Payer: Federal, State, Local not specified - PPO | Admitting: Family Medicine

## 2022-12-26 ENCOUNTER — Encounter: Payer: Self-pay | Admitting: Family Medicine

## 2022-12-26 VITALS — BP 115/63 | HR 61 | Ht 74.0 in | Wt 264.0 lb

## 2022-12-26 DIAGNOSIS — Z0000 Encounter for general adult medical examination without abnormal findings: Secondary | ICD-10-CM | POA: Diagnosis not present

## 2022-12-26 DIAGNOSIS — K5140 Inflammatory polyps of colon without complications: Secondary | ICD-10-CM | POA: Diagnosis not present

## 2022-12-26 DIAGNOSIS — K635 Polyp of colon: Secondary | ICD-10-CM | POA: Insufficient documentation

## 2022-12-26 NOTE — Progress Notes (Signed)
Established Patient Office Visit  Subjective   Patient ID: Wesley King, male    DOB: 1961-06-01  Age: 62 y.o. MRN: [id number]  Chief Complaint  Patient presents with   Annual Exam    Patient has no new concerns since his last visit.  No longer having any shortness of breath.  Still continuing to ride his bicycle daily.  Is a member of a riding group called [employer].  States he is trying to "eat clean" but still has snacks including cookies and chips and drinks juice and occasional soda.  Never smoker.  1 to 2 days a week of alcohol use.  Retired from the [employer] last year.  Has a grandchild less than 48-year-old.        ROS    Objective:     BP 115/63   Pulse 61   Ht 6\' 2"  (1.88 m)   Wt 264 lb (119.7 kg)   SpO2 97% Comment: on RA  BMI 33.90 kg/m    Physical Exam Constitutional:      Appearance: He is normal weight.  HENT:     Head: Normocephalic.     Right Ear: Tympanic membrane normal.     Left Ear: Tympanic membrane normal.     Nose: Nose normal.     Mouth/Throat:     Mouth: Mucous membranes are moist.     Pharynx: Oropharynx is clear. No oropharyngeal exudate.  Eyes:     Extraocular Movements: Extraocular movements intact.     Pupils: Pupils are equal, round, and reactive to light.  Cardiovascular:     Rate and Rhythm: Normal rate and regular rhythm.     Pulses: Normal pulses.     Heart sounds: No murmur heard. Pulmonary:     Effort: Pulmonary effort is normal. No respiratory distress.     Breath sounds: Normal breath sounds. No wheezing.  Abdominal:     General: Abdomen is flat.     Palpations: Abdomen is soft.     Hernia: A hernia (Small umbilical hernia) is present.  Musculoskeletal:        General: No swelling. Normal range of motion.     Cervical back: Normal range of motion.  Skin:    General: Skin is warm and dry.  Neurological:     General: No focal deficit present.     Mental Status: He is alert and oriented to person,  place, and time. Mental status is at baseline.  Psychiatric:        Mood and Affect: Mood normal.        Behavior: Behavior normal.      No results found for any visits on 12/26/22.    The 10-year ASCVD risk score (Arnett DK, et al., 2019) is: 7%    Assessment & Plan:   Problem List Items Addressed This Visit       Digestive   Colonic polyp     Other   Encounter for annual physical exam - Primary    No complaints.  He is up-to-date on all of his health maintenance issues.  Has declined vaccines.  Discussed healthy eating habits.  Encouraged him to continue his exercise habits.  Dyspnea from last year has resolved.  Has had coronary artery calcium score that showed a score of 0 last year.  Due to this we will not check his lipids until next visit a year from now.       No follow-ups on file.  Benay Pike, MD

## 2022-12-26 NOTE — Patient Instructions (Signed)
It was nice to meet you today,    We are not getting any labs because you have been up to date on all your typical lab work and cancer screenings.    Continue exercising and trying to eat healthy.  Limit your snacks and red meat consumption to help your cholesterol.   I would like to see you in a year.    If you need to see [place] sooner just schedule an appointment.   Have a great day,   Dr. Jeannine Kitten

## 2022-12-26 NOTE — Assessment & Plan Note (Signed)
No complaints.  He is up-to-date on all of his health maintenance issues.  Has declined vaccines.  Discussed healthy eating habits.  Encouraged him to continue his exercise habits.  Dyspnea from last year has resolved.  Has had coronary artery calcium score that showed a score of 0 last year.  Due to this we will not check his lipids until next visit a year from now.

## 2023-08-13 ENCOUNTER — Encounter: Payer: Self-pay | Admitting: Family Medicine

## 2023-08-13 ENCOUNTER — Ambulatory Visit (INDEPENDENT_AMBULATORY_CARE_PROVIDER_SITE_OTHER): Payer: Federal, State, Local not specified - PPO | Admitting: Family Medicine

## 2023-08-13 VITALS — BP 129/79 | HR 65 | Ht 74.0 in | Wt 267.1 lb

## 2023-08-13 DIAGNOSIS — M19019 Primary osteoarthritis, unspecified shoulder: Secondary | ICD-10-CM | POA: Insufficient documentation

## 2023-08-13 DIAGNOSIS — M5412 Radiculopathy, cervical region: Secondary | ICD-10-CM | POA: Insufficient documentation

## 2023-08-13 NOTE — Patient Instructions (Addendum)
It was nice to see you today,  We addressed the following topics today: -Your neck pain is something called cervical radiculopathy.  This is when the nerves coming out from your vertebrae get pinched or irritated. - Treatment options include over-the-counter NSAIDs for pain relief such as ibuprofen or naproxen. - I have also included a list of exercises you can do and stretching to help loosen up the muscles surrounding these nerves. - Physical therapy is an option.  If you would like, we can send in a referral - Orthopedic doctors can sometimes do local injections for more significant pain. - The shoulder pain you are experiencing is coming from your [facility] joint.  This can be treated with over-the-counter NSAIDs or steroid injections the same way the neck pain can be treated.  DG the yeah cheesecake to call her then just follow-up as needed okay down there  Have a great day,  Frederic Jericho, MD

## 2023-08-13 NOTE — Progress Notes (Signed)
   Acute Office Visit  Subjective:     Patient ID: Wesley King, male    DOB: 10/26/1960, 62 y.o.   MRN: [id number]  Chief Complaint  Patient presents with   Neck Pain    HPI Patient is here today for neck pain that has been going on for at least 1 month.  Describes it as a burning sensation in the neck, more towards the right side.  Does not radiate down the arms.  No weakness.  Occurs every day and last about 15 to 20 minutes.  Does not recall doing anything to injure his neck.  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  Does not do any heavy lifting with his neck, does do shrugs.  Patient also complains of chronic issues with his right shoulder.  It is anterior.  It is a chronic waxing and waning pain.    ROS      Objective:    BP 129/79   Pulse 65   Ht 6\' 2"  (1.88 m)   Wt 267 lb 1.9 oz (121.2 kg)   SpO2 97%   BMI 34.30 kg/m    Physical Exam General: Alert, oriented MSK: Minimal tenderness to palpation over the right side C6 region.  Decreased range of motion with right sided neck rotation and right-sided lateral flexion.  Normal empty can test.  Minimal tenderness to palpation over the right AC joint.  Some pain at the [facility] joint with right arm cross body adduction test  No results found for any visits on 08/13/23.      Assessment & Plan:   Cervical radiculopathy Assessment & Plan: Approximately 1 episode daily of 15 to 20 minutes paresthesia in the right sided trapezius near the C5-C6 level.  Discussed conservative management including stretching, exercises, NSAIDs as needed.  Offered PT or orthopedic referral, patient desires but symptoms are mild at this time.   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  Discussed conservative management w/ nsaids. Discussed local injections as an option if symptoms worsen.        Return if symptoms worsen or fail to improve.  Sandre Kitty, MD

## 2023-08-13 NOTE — Assessment & Plan Note (Signed)
Approximately 1 episode daily of 15 to 20 minutes paresthesia in the right sided trapezius near the C5-C6 level.  Discussed conservative management including stretching, exercises, NSAIDs as needed.  Offered PT or orthopedic referral, patient desires but symptoms are mild at this time.

## 2023-08-13 NOTE — Assessment & Plan Note (Signed)
Right sided.  Discussed conservative management w/ nsaids. Discussed local injections as an option if symptoms worsen.

## 2023-09-08 ENCOUNTER — Telehealth: Payer: Self-pay | Admitting: *Deleted

## 2023-09-08 NOTE — Telephone Encounter (Unsigned)
Copied from CRM [id number]. Topic: Referral - Request for Referral >> Sep 08, 2023  1:29 PM Alvino Blood C wrote: Did the patient discuss referral with their provider in the last year? Yes (If No - schedule appointment) (If Yes - send message)  Appointment offered? No  Type of order/referral and detailed reason for visit: Physical therapy  Preference of office, provider, location: None provided  If referral order, have you been seen by this specialty before? No (If Yes, this issue or another issue? When? Where?  Can we respond through MyChart? Yes

## 2023-09-09 ENCOUNTER — Other Ambulatory Visit: Payer: Self-pay | Admitting: Family Medicine

## 2023-09-09 DIAGNOSIS — M5412 Radiculopathy, cervical region: Secondary | ICD-10-CM

## 2023-09-09 NOTE — Telephone Encounter (Signed)
Order for physical therapy sent in

## 2023-09-10 NOTE — Telephone Encounter (Signed)
Referral sent to [facility], they will contact pt to schedule.

## 2023-10-06 ENCOUNTER — Other Ambulatory Visit: Payer: Self-pay

## 2023-10-06 ENCOUNTER — Encounter: Payer: Self-pay | Admitting: Physical Therapy

## 2023-10-06 ENCOUNTER — Ambulatory Visit: Payer: Federal, State, Local not specified - PPO | Attending: Family Medicine | Admitting: Physical Therapy

## 2023-10-06 DIAGNOSIS — M5412 Radiculopathy, cervical region: Secondary | ICD-10-CM | POA: Insufficient documentation

## 2023-10-06 DIAGNOSIS — M6281 Muscle weakness (generalized): Secondary | ICD-10-CM | POA: Insufficient documentation

## 2023-10-06 DIAGNOSIS — M542 Cervicalgia: Secondary | ICD-10-CM | POA: Diagnosis not present

## 2023-10-06 NOTE — Patient Instructions (Signed)
 Access Code: [email or web address] URL: [email or web address] Date: 10/06/2023 Prepared by: Elaine Daring  Exercises - Seated Neck Retraction  - 3-5 x daily - 10 reps - 5 seconds hold - Cervical Extension AROM with Strap  - 1 x daily - 3 sets - 10 reps - Seated Assisted Cervical Rotation with Towel  - 1 x daily - 3 sets - 10 reps - Shoulder External Rotation with Anchored Resistance  - 1 x daily - 3 sets - 15 reps

## 2023-10-06 NOTE — Therapy (Signed)
 OUTPATIENT PHYSICAL THERAPY EVALUATION   Patient Name: Wesley King MRN: [id number] DOB:29-Jun-1961, 63 y.o., male Today's Date: 10/06/2023   END OF SESSION:  PT End of Session - 10/06/23 0840     Visit Number 1    Number of Visits 9    Date for PT Re-Evaluation 12/01/23    Authorization Type BCBS / UHC    PT Start Time 0845    PT Stop Time 0930    PT Time Calculation (min) 45 min    Activity Tolerance Patient tolerated treatment well    Behavior During Therapy [facility] for tasks assessed/performed             Past Medical History:  Diagnosis Date   Hyperlipidemia    Testicular torsion    Past Surgical History:  Procedure Laterality Date   KNEE SURGERY     testicular torsion repair     Patient Active Problem List   Diagnosis Date Noted   Cervical radiculopathy 08/13/2023   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   Colonic polyp 12/26/2022   Dizziness 06/04/2022   Dyspnea on exertion 06/04/2022   Muscle cramps 06/04/2022   Other fatigue 06/04/2022   Osteoarthritis of left knee 10/27/2020   Chronic knee pain after total replacement of left knee joint 08/09/2020   BMI 36.0-36.9,adult 07/12/2019   Hematochezia 07/12/2019   Umbilical hernia 07/12/2019   Bilateral hearing loss 07/06/2018   Encounter for annual physical exam 06/05/2018   Elevated LDL cholesterol level 06/05/2018    PCP: Chandra Toribio POUR, MD  REFERRING PROVIDER: Chandra Toribio POUR, MD  REFERRING DIAG: Cervical radiculopathy  THERAPY DIAG:  Cervicalgia  Muscle weakness (generalized)  Rationale for Evaluation and Treatment: Rehabilitation  ONSET DATE: Chronic   SUBJECTIVE:          SUBJECTIVE STATEMENT: Patient reports neck stiffness and soreness for about the past 6 months, and sometime he gets a burning sensation in a specific spot. The burning sensation is located lower neck on right side. He also notes right shoulder pain for a good while that is getting worse. He likes to work out and any  overhead or bench press activity will cause terrible pain a few hours after his exercises. He also rides a road bike and he has trouble turning his neck to look behind him. He also notes that his sleep is disturbed due to his neck and shoulder, he has trouble getting comfortable. He does feel like he is limited with the right arm reaching behind his back and overhead. His pain seems to be worse with activity and not at any point during the day.   Hand dominance: Right  PERTINENT HISTORY:  See PMH above  PAIN:  Are you having pain? Yes:  NPRS scale: 2-3/10 at worst 5-6/10 Pain location: Neck Pain description: Stiff, sore, burning Aggravating factors: Turning neck, lifting, activity Relieving factors: Stretching  NPRS scale: 3/10 at worst 7-8/10 Pain location: Right shoulder Pain description: Throbbing Aggravating factors: Lifting, bench press Relieving factors: Rest, ice  PRECAUTIONS: None  RED FLAGS: None    WEIGHT BEARING RESTRICTIONS: No  FALLS:  Has patient fallen in last 6 months? No  OCCUPATION: No  PLOF: Independent  PATIENT GOALS: Reduce pain with working out   OBJECTIVE:  Note: Objective measures were completed at Evaluation unless otherwise noted. PATIENT SURVEYS:  FOTO 61% functional status  COGNITION: Overall cognitive status: Within functional limits for tasks assessed  SENSATION: WFL  POSTURE:   Rounded shoulder and forward head posture  PALPATION: Tender to palpation right cervical paraspinals and upper trap region, right LH biceps tendon region   CERVICAL ROM:   Active ROM A/PROM (deg) eval  Flexion 50  Extension 20  Right lateral flexion 15  Left lateral flexion 20  Right rotation 45  Left rotation 50   (Blank rows = not tested)  UPPER EXTREMITY ROM:  Active ROM Right eval Left eval  Shoulder flexion 140 140  Shoulder extension    Shoulder abduction    Shoulder adduction    Shoulder extension    Shoulder internal rotation  T12 T12  Shoulder external rotation T2 T2  Elbow flexion    Elbow extension    Wrist flexion    Wrist extension    Wrist ulnar deviation    Wrist radial deviation    Wrist pronation    Wrist supination     (Blank rows = not tested)   Eval: patient reports pain with all right shoulder movements  UPPER EXTREMITY MMT:  MMT Right eval Left eval  Shoulder flexion 4+ 5  Shoulder extension    Shoulder abduction 5 5  Shoulder adduction    Shoulder extension    Shoulder internal rotation 5 5  Shoulder external rotation 4 5  Middle trapezius 4- 4-  Lower trapezius 4- 4-  Elbow flexion    Elbow extension    Wrist flexion    Wrist extension    Wrist ulnar deviation    Wrist radial deviation    Wrist pronation    Wrist supination    Grip strength     (Blank rows = not tested)  CERVICAL SPECIAL TESTS:  Spurling's negative on right  FUNCTIONAL TESTS:  DNF endurance: 7 seconds   TREATMENT DATE:  [facility] Adult PT Treatment:                                                DATE: 10/05/2022 Therapeutic Exercise: Seated cervical retraction 10 x 5 sec Seated cervical SNAG for extension and rotation x 10 each Shoulder ER with blue x 15  PATIENT EDUCATION:  Education details: Exam findings, POC, HEP Person educated: Patient Education method: Programmer, Multimedia, Demonstration, Tactile cues, Verbal cues, and Handouts Education comprehension: verbalized understanding, returned demonstration, verbal cues required, tactile cues required, and needs further education  HOME EXERCISE PROGRAM: Access Code: [email or web address]   ASSESSMENT: CLINICAL IMPRESSION: Patient is a 63 y.o. male who was seen today for physical therapy evaluation and treatment for chronic neck and right shoulder pain. He demonstrates limitations with his cervical mobility and active motion, postural deficits with impaired DNF endurance and periscapular muscle strength, right rotator cuff strength deficit, and pain with active right  shoulder motion.     OBJECTIVE IMPAIRMENTS: decreased activity tolerance, decreased ROM, decreased strength, postural dysfunction, and pain.   ACTIVITY LIMITATIONS: carrying, lifting, sleeping, and reach over head  PARTICIPATION LIMITATIONS: community activity  PERSONAL FACTORS: Time since onset of injury/illness/exacerbation are also affecting patient's functional outcome.   REHAB POTENTIAL: Good  CLINICAL DECISION MAKING: Stable/uncomplicated  EVALUATION COMPLEXITY: Low   GOALS: Goals reviewed with patient? Yes  SHORT TERM GOALS: Target date: 11/03/2023  Patient will be I with initial HEP in order to progress with therapy. Baseline: HEP provided at eval Goal status: INITIAL  2.  Patient will report neck and right shoulder pain </= 4/10 with activities such as  working out in order to reduce functional limitations Baseline: see pain level above Goal status: INITIAL  LONG TERM GOALS: Target date: 12/01/2023  Patient will be I with final HEP to maintain progress from PT. Baseline: HEP provided at eval Goal status: INITIAL  2.  Patient will report >/= 68% status on FOTO to indicate improved functional ability. Baseline: 61% functional status Goal status: INITIAL  3.  Patient will demonstrate cervical rotation >/= 55 deg in order to improve turning head while riding bike Baseline: see limitations above Goal status: INITIAL  4.  Patient will demonstrate DNF endurance >/= 30 sec in order to improve postural control and reduce pain with activity Baseline: 7 seconds Goal status: INITIAL   PLAN: PT FREQUENCY: 1x/week  PT DURATION: 8 weeks  PLANNED INTERVENTIONS: 97164- PT Re-evaluation, 97110-Therapeutic exercises, 97530- Therapeutic activity, 97112- Neuromuscular re-education, 97535- Self Care, [id number]- Manual therapy, 97014- Electrical stimulation (unattended), [phone]- Electrical stimulation (manual), Patient/Family education, Taping, Dry Needling, Joint mobilization, Joint  manipulation, Spinal manipulation, Spinal mobilization, Cryotherapy, and Moist heat  PLAN FOR NEXT SESSION: Review HEP and progress PRN, manual/TPDN for right cervical and upper trap region, cervical and thoracic mobility, DNF endurance and postural strengthening, right rotator cuff strengthening   Elaine Daring, PT, DPT, LAT, ATC 10/06/23  10:03 AM Phone: [phone] Fax: [phone]

## 2023-10-15 NOTE — Therapy (Signed)
OUTPATIENT PHYSICAL THERAPY TREATMENT   Patient Name: Wesley King MRN: [id number] DOB:July 27, 1961, 63 y.o., male Today's Date: 10/16/2023   END OF SESSION:  PT End of Session - 10/16/23 0919     Visit Number 2    Number of Visits 9    Date for PT Re-Evaluation 12/01/23    Authorization Type BCBS / [employer]    PT Start Time 0930    PT Stop Time 1015    PT Time Calculation (min) 45 min    Activity Tolerance Patient tolerated treatment well    Behavior During Therapy [facility] for tasks assessed/performed              Past Medical History:  Diagnosis Date   Hyperlipidemia    Testicular torsion    Past Surgical History:  Procedure Laterality Date   KNEE SURGERY     testicular torsion repair     Patient Active Problem List   Diagnosis Date Noted   Cervical radiculopathy 08/13/2023   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   Colonic polyp 12/26/2022   Dizziness 06/04/2022   Dyspnea on exertion 06/04/2022   Muscle cramps 06/04/2022   Other fatigue 06/04/2022   Osteoarthritis of left knee 10/27/2020   Chronic knee pain after total replacement of left knee joint 08/09/2020   BMI 36.0-36.9,adult 07/12/2019   Hematochezia 07/12/2019   Umbilical hernia 07/12/2019   Bilateral hearing loss 07/06/2018   Encounter for annual physical exam 06/05/2018   Elevated LDL cholesterol level 06/05/2018    PCP: Sandre Kitty, MD  REFERRING PROVIDER: Sandre Kitty, MD  REFERRING DIAG: Cervical radiculopathy  THERAPY DIAG:  Cervicalgia  Muscle weakness (generalized)  Rationale for Evaluation and Treatment: Rehabilitation  ONSET DATE: Chronic   SUBJECTIVE:          SUBJECTIVE STATEMENT: Patient reports neck and shoulder still waking him up in the middle of the night and difficult to find a comfortable position. States the neck feels a little bit better but the shoulder continues to bother him.   EVAL: Patient reports neck stiffness and soreness for about the past 6 months, and  sometime he gets a burning sensation in a specific spot. The burning sensation is located lower neck on right side. He also notes right shoulder pain for a good while that is getting worse. He likes to work out and any overhead or bench press activity will cause terrible pain a few hours after his exercises. He also rides a road bike and he has trouble turning his neck to look behind him. He also notes that his sleep is disturbed due to his neck and shoulder, he has trouble getting comfortable. He does feel like he is limited with the right arm reaching behind his back and overhead. His pain seems to be worse with activity and not at any point during the day.   Hand dominance: Right  PERTINENT HISTORY:  See PMH above  PAIN:  Are you having pain? Yes:  NPRS scale: 2/10 at worst 5-6/10 Pain location: Neck Pain description: Stiff, sore, burning Aggravating factors: Turning neck, lifting, activity Relieving factors: Stretching  NPRS scale: 5/10 at worst 7-8/10 Pain location: Right shoulder Pain description: Throbbing Aggravating factors: Lifting, bench press Relieving factors: Rest, ice  PRECAUTIONS: None  RED FLAGS: None    WEIGHT BEARING RESTRICTIONS: No  FALLS:  Has patient fallen in last 6 months? No  OCCUPATION: No  PLOF: Independent  PATIENT GOALS: Reduce pain with working out   OBJECTIVE:  Note: Objective measures were completed at Evaluation unless otherwise noted. PATIENT SURVEYS:  FOTO 61% functional status  POSTURE:   Rounded shoulder and forward head posture  PALPATION: Tender to palpation right cervical paraspinals and upper trap region, right LH biceps tendon region   CERVICAL ROM:   Active ROM A/PROM (deg) eval  Flexion 50  Extension 20  Right lateral flexion 15  Left lateral flexion 20  Right rotation 45  Left rotation 50   (Blank rows = not tested)  UPPER EXTREMITY ROM:  Active ROM Right eval Left eval  Shoulder flexion 140 140   Shoulder extension    Shoulder abduction    Shoulder adduction    Shoulder extension    Shoulder internal rotation T12 T12  Shoulder external rotation T2 T2  Elbow flexion    Elbow extension    Wrist flexion    Wrist extension    Wrist ulnar deviation    Wrist radial deviation    Wrist pronation    Wrist supination     (Blank rows = not tested)   Eval: patient reports pain with all right shoulder movements  UPPER EXTREMITY MMT:  MMT Right eval Left eval  Shoulder flexion 4+ 5  Shoulder extension    Shoulder abduction 5 5  Shoulder adduction    Shoulder extension    Shoulder internal rotation 5 5  Shoulder external rotation 4 5  Middle trapezius 4- 4-  Lower trapezius 4- 4-  Elbow flexion    Elbow extension    Wrist flexion    Wrist extension    Wrist ulnar deviation    Wrist radial deviation    Wrist pronation    Wrist supination    Grip strength     (Blank rows = not tested)  CERVICAL SPECIAL TESTS:  Spurling's negative on right  FUNCTIONAL TESTS:  DNF endurance: 7 seconds   TREATMENT DATE:  [facility] Adult PT Treatment:                                                DATE: 10/15/2022 Therapeutic Exercise: UBE L4 x 4 min (fwd/bwd) while taking subjective Sidelying thoracic rotation x 10 each Supine chest press with yellow loop at wrists x 10 Supine bent elbow shoulder flexion with yellow loop at wrists x 10 Seated double ER and scap retraction with blue 2 x 10 Standing bent elbow shoulder flexion with yellow loop at wrists 2 x 10 Single arm row with FM 23# 2 x 10 Manual: Skilled palpation and monitoring of muscle tension while performing TPDN STM right upper trap  Trigger Point Dry Needling  Initial Treatment: Pt instructed on Dry Needling rational, procedures, and possible side effects. Pt instructed to expect mild to moderate muscle soreness later in the day and/or into the next day.  Pt instructed in methods to reduce muscle soreness. Pt instructed  to continue prescribed HEP. Because Dry Needling was performed over or adjacent to a lung field, pt was educated on S/S of pneumothorax and to seek immediate medical attention should they occur.  Patient was educated on signs and symptoms of infection and other risk factors and advised to seek medical attention should they occur.  Patient verbalized understanding of these instructions and education.   Patient Verbal Consent Given: Yes Education Handout Provided: Yes Muscles Treated: Right upper trap Electrical Stimulation Performed:  No Treatment Response/Outcome: Twitch response, patient report improvement in symptoms   [facility] Adult PT Treatment:                                                DATE: 10/05/2022 Therapeutic Exercise: Seated cervical retraction 10 x 5 sec Seated cervical SNAG for extension and rotation x 10 each Shoulder ER with blue x 15  PATIENT EDUCATION:  Education details: HEP update Person educated: Patient Education method: Explanation, Demonstration, Tactile cues, Verbal cues, and Handouts Education comprehension: verbalized understanding, returned demonstration, verbal cues required, tactile cues required, and needs further education  HOME EXERCISE PROGRAM: Access Code: [email or web address]   ASSESSMENT: CLINICAL IMPRESSION: Patient tolerated therapy well with no adverse effects. He reports improvement in neck pain but continued anterior right shoulder pain that seems to be consistent with biceps tendon pain. Therapy performed TPDN for the right upper trap with multiple twitch responses and report of reduced neck stiffness. Focused on rotator cuff strengthening and activation through shoulder elevation movements. He does exhibit difficulty and muscular fatigue maintaining rotator cuff control of the right shoulder with shoulder elevation and he does require cueing for proper scapular retraction/depression with row to avoid anterior glide of the humeral head. Updated his HEP with  good tolerance. Patient would benefit from continued skilled PT to progress his mobility and strength in order to reduce pain and maximize functional ability.   EVAL: Patient is a 63 y.o. male who was seen today for physical therapy evaluation and treatment for chronic neck and right shoulder pain. He demonstrates limitations with his cervical mobility and active motion, postural deficits with impaired DNF endurance and periscapular muscle strength, right rotator cuff strength deficit, and pain with active right shoulder motion.    OBJECTIVE IMPAIRMENTS: decreased activity tolerance, decreased ROM, decreased strength, postural dysfunction, and pain.   ACTIVITY LIMITATIONS: carrying, lifting, sleeping, and reach over head  PARTICIPATION LIMITATIONS: community activity  PERSONAL FACTORS: Time since onset of injury/illness/exacerbation are also affecting patient's functional outcome.    GOALS: Goals reviewed with patient? Yes  SHORT TERM GOALS: Target date: 11/03/2023  Patient will be I with initial HEP in order to progress with therapy. Baseline: HEP provided at eval Goal status: INITIAL  2.  Patient will report neck and right shoulder pain </= 4/10 with activities such as working out in order to reduce functional limitations Baseline: see pain level above Goal status: INITIAL  LONG TERM GOALS: Target date: 12/01/2023  Patient will be I with final HEP to maintain progress from PT. Baseline: HEP provided at eval Goal status: INITIAL  2.  Patient will report >/= 68% status on FOTO to indicate improved functional ability. Baseline: 61% functional status Goal status: INITIAL  3.  Patient will demonstrate cervical rotation >/= 55 deg in order to improve turning head while riding bike Baseline: see limitations above Goal status: INITIAL  4.  Patient will demonstrate DNF endurance >/= 30 sec in order to improve postural control and reduce pain with activity Baseline: 7 seconds Goal  status: INITIAL   PLAN: PT FREQUENCY: 1x/week  PT DURATION: 8 weeks  PLANNED INTERVENTIONS: 97164- PT Re-evaluation, 97110-Therapeutic exercises, 97530- Therapeutic activity, 97112- Neuromuscular re-education, 97535- Self Care, 54098- Manual therapy, 97014- Electrical stimulation (unattended), [id number]- Electrical stimulation (manual), Patient/Family education, Taping, Dry Needling, Joint mobilization, Joint manipulation, Spinal manipulation, Spinal mobilization, Cryotherapy, and Moist  heat  PLAN FOR NEXT SESSION: Review HEP and progress PRN, manual/TPDN for right cervical and upper trap region, cervical and thoracic mobility, DNF endurance and postural strengthening, right rotator cuff strengthening   Rosana Hoes, PT, DPT, LAT, ATC 10/16/23  11:05 AM Phone: [phone] Fax: [phone]

## 2023-10-16 ENCOUNTER — Ambulatory Visit: Payer: Federal, State, Local not specified - PPO | Admitting: Physical Therapy

## 2023-10-16 ENCOUNTER — Encounter: Payer: Self-pay | Admitting: Physical Therapy

## 2023-10-16 ENCOUNTER — Other Ambulatory Visit: Payer: Self-pay

## 2023-10-16 DIAGNOSIS — M6281 Muscle weakness (generalized): Secondary | ICD-10-CM

## 2023-10-16 DIAGNOSIS — M5412 Radiculopathy, cervical region: Secondary | ICD-10-CM | POA: Diagnosis not present

## 2023-10-16 DIAGNOSIS — M542 Cervicalgia: Secondary | ICD-10-CM

## 2023-10-22 ENCOUNTER — Ambulatory Visit: Payer: Federal, State, Local not specified - PPO

## 2023-10-22 DIAGNOSIS — M542 Cervicalgia: Secondary | ICD-10-CM

## 2023-10-22 DIAGNOSIS — M5412 Radiculopathy, cervical region: Secondary | ICD-10-CM | POA: Diagnosis not present

## 2023-10-22 DIAGNOSIS — M6281 Muscle weakness (generalized): Secondary | ICD-10-CM | POA: Diagnosis not present

## 2023-10-22 NOTE — Therapy (Signed)
OUTPATIENT PHYSICAL THERAPY TREATMENT   Patient Name: Wesley King MRN: [id number] DOB:01-25-1961, 63 y.o., male Today's Date: 10/22/2023   END OF SESSION:  PT End of Session - 10/22/23 0754     Visit Number 3    Number of Visits 9    Date for PT Re-Evaluation 12/01/23    Authorization Type BC[employer] / [employer]    PT Start Time 0800    PT Stop Time 0840    PT Time Calculation (min) 40 min    Activity Tolerance Patient tolerated treatment well    Behavior During Therapy [facility] for tasks assessed/performed              Past Medical History:  Diagnosis Date   Hyperlipidemia    Testicular torsion    Past Surgical History:  Procedure Laterality Date   KNEE SURGERY     testicular torsion repair     Patient Active Problem List   Diagnosis Date Noted   Cervical radiculopathy 08/13/2023   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   Colonic polyp 12/26/2022   Dizziness 06/04/2022   Dyspnea on exertion 06/04/2022   Muscle cramps 06/04/2022   Other fatigue 06/04/2022   Osteoarthritis of left knee 10/27/2020   Chronic knee pain after total replacement of left knee joint 08/09/2020   BMI 36.0-36.9,adult 07/12/2019   Hematochezia 07/12/2019   Umbilical hernia 07/12/2019   Bilateral hearing loss 07/06/2018   Encounter for annual physical exam 06/05/2018   Elevated LDL cholesterol level 06/05/2018    PCP: Sandre Kitty, MD  REFERRING PROVIDER: Sandre Kitty, MD  REFERRING DIAG: Cervical radiculopathy  THERAPY DIAG:  Cervicalgia  Muscle weakness (generalized)  Rationale for Evaluation and Treatment: Rehabilitation  ONSET DATE: Chronic   SUBJECTIVE:          SUBJECTIVE STATEMENT: Patient reports neck and shoulder still waking him up in the middle of the night and difficult to find a comfortable position. States the neck feels a little bit better but the shoulder continues to bother him.   EVAL: Patient reports neck stiffness and soreness for about the past 6 months, and  sometime he gets a burning sensation in a specific spot. The burning sensation is located lower neck on right side. He also notes right shoulder pain for a good while that is getting worse. He likes to work out and any overhead or bench press activity will cause terrible pain a few hours after his exercises. He also rides a road bike and he has trouble turning his neck to look behind him. He also notes that his sleep is disturbed due to his neck and shoulder, he has trouble getting comfortable. He does feel like he is limited with the right arm reaching behind his back and overhead. His pain seems to be worse with activity and not at any point during the day.   Hand dominance: Right  PERTINENT HISTORY:  See PMH above  PAIN:  Are you having pain? Yes:  NPRS scale: 2/10 at worst 5-6/10 Pain location: Neck Pain description: Stiff, sore, burning Aggravating factors: Turning neck, lifting, activity Relieving factors: Stretching  NPRS scale: 5/10 at worst 7-8/10 Pain location: Right shoulder Pain description: Throbbing Aggravating factors: Lifting, bench press Relieving factors: Rest, ice  PRECAUTIONS: None  RED FLAGS: None    WEIGHT BEARING RESTRICTIONS: No  FALLS:  Has patient fallen in last 6 months? No  OCCUPATION: No  PLOF: Independent  PATIENT GOALS: Reduce pain with working out   OBJECTIVE:  Note: Objective measures were completed at Evaluation unless otherwise noted. PATIENT SURVEYS:  FOTO 61% functional status  POSTURE:   Rounded shoulder and forward head posture  PALPATION: Tender to palpation right cervical paraspinals and upper trap region, right LH biceps tendon region   CERVICAL ROM:   Active ROM A/PROM (deg) eval  Flexion 50  Extension 20  Right lateral flexion 15  Left lateral flexion 20  Right rotation 45  Left rotation 50   (Blank rows = not tested)  UPPER EXTREMITY ROM:  Active ROM Right eval Left eval  Shoulder flexion 140 140   Shoulder extension    Shoulder abduction    Shoulder adduction    Shoulder extension    Shoulder internal rotation T12 T12  Shoulder external rotation T2 T2  Elbow flexion    Elbow extension    Wrist flexion    Wrist extension    Wrist ulnar deviation    Wrist radial deviation    Wrist pronation    Wrist supination     (Blank rows = not tested)   Eval: patient reports pain with all right shoulder movements  UPPER EXTREMITY MMT:  MMT Right eval Left eval  Shoulder flexion 4+ 5  Shoulder extension    Shoulder abduction 5 5  Shoulder adduction    Shoulder extension    Shoulder internal rotation 5 5  Shoulder external rotation 4 5  Middle trapezius 4- 4-  Lower trapezius 4- 4-  Elbow flexion    Elbow extension    Wrist flexion    Wrist extension    Wrist ulnar deviation    Wrist radial deviation    Wrist pronation    Wrist supination    Grip strength     (Blank rows = not tested)  CERVICAL SPECIAL TESTS:  Spurling's negative on right  FUNCTIONAL TESTS:  DNF endurance: 7 seconds   TREATMENT DATE:  [facility] Adult PT Treatment:                                                DATE: 10/21/2022 Therapeutic Exercise: UBE L1.5 x 4 min (fwd/bwd) while taking subjective Supine horizontal abd 2x15 blue band Sidelying thoracic rotation x 10 each Supine serratus raise 2x10 YTB Supine chin tuck x 10 - 5" hold Seated bilateral ER 2x10 blue band Seated low row 2x10 55# Seated lat pulldown 2x10 55# R shoulder ER 2x10 3# Manual Therapy: Skilled palpation and monitoring of muscle tension while performing TPDN Trigger Point Dry Needling:  Ongoing Treatment: Pt instructed on Dry Needling rational, procedures, and possible side effects. Pt instructed to expect mild to moderate muscle soreness later in the day and/or into the next day.  Pt instructed in methods to reduce muscle soreness. Pt instructed to continue prescribed HEP. Because Dry Needling was performed over or  adjacent to a lung field, pt was educated on S/S of pneumothorax and to seek immediate medical attention should they occur.  Patient was educated on signs and symptoms of infection and other risk factors and advised to seek medical attention should they occur.  Patient verbalized understanding of these instructions and education.   Patient Verbal Consent Given: Yes Education Handout Provided: Yes Muscles Treated: Right upper trap Electrical Stimulation Performed: No Treatment Response/Outcome: Twitch response, patient report improvement in symptoms  [facility] Adult PT Treatment:  DATE: 10/15/2022 Therapeutic Exercise: UBE L4 x 4 min (fwd/bwd) while taking subjective Sidelying thoracic rotation x 10 each Supine chest press with yellow loop at wrists x 10 Supine bent elbow shoulder flexion with yellow loop at wrists x 10 Seated double ER and scap retraction with blue 2 x 10 Standing bent elbow shoulder flexion with yellow loop at wrists 2 x 10 Single arm row with FM 23# 2 x 10 Manual: Skilled palpation and monitoring of muscle tension while performing TPDN STM right upper trap  Trigger Point Dry Needling  Initial Treatment: Pt instructed on Dry Needling rational, procedures, and possible side effects. Pt instructed to expect mild to moderate muscle soreness later in the day and/or into the next day.  Pt instructed in methods to reduce muscle soreness. Pt instructed to continue prescribed HEP. Because Dry Needling was performed over or adjacent to a lung field, pt was educated on S/S of pneumothorax and to seek immediate medical attention should they occur.  Patient was educated on signs and symptoms of infection and other risk factors and advised to seek medical attention should they occur.  Patient verbalized understanding of these instructions and education.   Patient Verbal Consent Given: Yes Education Handout Provided: Yes Muscles Treated:  Right upper trap Electrical Stimulation Performed: No Treatment Response/Outcome: Twitch response, patient report improvement in symptoms   [facility] Adult PT Treatment:                                                DATE: 10/05/2022 Therapeutic Exercise: Seated cervical retraction 10 x 5 sec Seated cervical SNAG for extension and rotation x 10 each Shoulder ER with blue x 15  PATIENT EDUCATION:  Education details: HEP update Person educated: Patient Education method: Explanation, Demonstration, Tactile cues, Verbal cues, and Handouts Education comprehension: verbalized understanding, returned demonstration, verbal cues required, tactile cues required, and needs further education  HOME EXERCISE PROGRAM: Access Code: [email or web address]   ASSESSMENT: CLINICAL IMPRESSION: Pt was able to complete all prescribed exercises with no adverse effect. He responded well to TPDN today, noting decreasing in muscle tension post session. Exercises today focused on continued periscpaular, DNF, and RTC strengthening. Pt is progressing well overall, PT will continue to progress as able per POC.    EVAL: Patient is a 63 y.o. male who was seen today for physical therapy evaluation and treatment for chronic neck and right shoulder pain. He demonstrates limitations with his cervical mobility and active motion, postural deficits with impaired DNF endurance and periscapular muscle strength, right rotator cuff strength deficit, and pain with active right shoulder motion.    OBJECTIVE IMPAIRMENTS: decreased activity tolerance, decreased ROM, decreased strength, postural dysfunction, and pain.   ACTIVITY LIMITATIONS: carrying, lifting, sleeping, and reach over head  PARTICIPATION LIMITATIONS: community activity  PERSONAL FACTORS: Time since onset of injury/illness/exacerbation are also affecting patient's functional outcome.    GOALS: Goals reviewed with patient? Yes  SHORT TERM GOALS: Target date: 11/03/2023  Patient  will be I with initial HEP in order to progress with therapy. Baseline: HEP provided at eval Goal status: INITIAL  2.  Patient will report neck and right shoulder pain </= 4/10 with activities such as working out in order to reduce functional limitations Baseline: see pain level above Goal status: INITIAL  LONG TERM GOALS: Target date: 12/01/2023  Patient will be I with final  HEP to maintain progress from PT. Baseline: HEP provided at eval Goal status: INITIAL  2.  Patient will report >/= 68% status on FOTO to indicate improved functional ability. Baseline: 61% functional status Goal status: INITIAL  3.  Patient will demonstrate cervical rotation >/= 55 deg in order to improve turning head while riding bike Baseline: see limitations above Goal status: INITIAL  4.  Patient will demonstrate DNF endurance >/= 30 sec in order to improve postural control and reduce pain with activity Baseline: 7 seconds Goal status: INITIAL   PLAN: PT FREQUENCY: 1x/week  PT DURATION: 8 weeks  PLANNED INTERVENTIONS: 97164- PT Re-evaluation, 97110-Therapeutic exercises, 97530- Therapeutic activity, 97112- Neuromuscular re-education, 97535- Self Care, 13244- Manual therapy, 97014- Electrical stimulation (unattended), [id number]- Electrical stimulation (manual), Patient/Family education, Taping, Dry Needling, Joint mobilization, Joint manipulation, Spinal manipulation, Spinal mobilization, Cryotherapy, and Moist heat  PLAN FOR NEXT SESSION: Review HEP and progress PRN, manual/TPDN for right cervical and upper trap region, cervical and thoracic mobility, DNF endurance and postural strengthening, right rotator cuff strengthening   Eloy End PT  10/22/23 8:52 AM

## 2023-11-10 ENCOUNTER — Telehealth: Payer: Self-pay

## 2023-11-10 ENCOUNTER — Ambulatory Visit: Payer: Federal, State, Local not specified - PPO | Attending: Family Medicine

## 2023-11-10 DIAGNOSIS — M6281 Muscle weakness (generalized): Secondary | ICD-10-CM | POA: Insufficient documentation

## 2023-11-10 DIAGNOSIS — M542 Cervicalgia: Secondary | ICD-10-CM | POA: Insufficient documentation

## 2023-11-10 NOTE — Telephone Encounter (Signed)
 PT called and left voicemail message regarding first no show. Left reminder of attendance policy and next appointment.   [name]   11/10/23 8:29 AM

## 2023-11-17 ENCOUNTER — Ambulatory Visit: Payer: Federal, State, Local not specified - PPO

## 2023-11-18 ENCOUNTER — Ambulatory Visit: Payer: Federal, State, Local not specified - PPO

## 2023-11-18 DIAGNOSIS — M6281 Muscle weakness (generalized): Secondary | ICD-10-CM

## 2023-11-18 DIAGNOSIS — M542 Cervicalgia: Secondary | ICD-10-CM

## 2023-11-18 NOTE — Therapy (Signed)
 OUTPATIENT PHYSICAL THERAPY TREATMENT   Patient Name: Wesley King MRN: [id number] DOB:1961/07/07, 63 y.o., male Today's Date: 11/18/2023   END OF SESSION:  PT End of Session - 11/18/23 0753     Visit Number 4    Number of Visits 9    Date for PT Re-Evaluation 12/01/23    Authorization Type BCBS / [facility]    PT Start Time 0800    Activity Tolerance Patient tolerated treatment well    Behavior During Therapy [facility] for tasks assessed/performed               Past Medical History:  Diagnosis Date   Hyperlipidemia    Testicular torsion    Past Surgical History:  Procedure Laterality Date   KNEE SURGERY     testicular torsion repair     Patient Active Problem List   Diagnosis Date Noted   Cervical radiculopathy 08/13/2023   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   Colonic polyp 12/26/2022   Dizziness 06/04/2022   Dyspnea on exertion 06/04/2022   Muscle cramps 06/04/2022   Other fatigue 06/04/2022   Osteoarthritis of left knee 10/27/2020   Chronic knee pain after total replacement of left knee joint 08/09/2020   BMI 36.0-36.9,adult 07/12/2019   Hematochezia 07/12/2019   Umbilical hernia 07/12/2019   Bilateral hearing loss 07/06/2018   Encounter for annual physical exam 06/05/2018   Elevated LDL cholesterol level 06/05/2018    PCP: Sandre Kitty, MD  REFERRING PROVIDER: Sandre Kitty, MD  REFERRING DIAG: Cervical radiculopathy  THERAPY DIAG:  Cervicalgia  Muscle weakness (generalized)  Rationale for Evaluation and Treatment: Rehabilitation  ONSET DATE: Chronic   SUBJECTIVE:          SUBJECTIVE STATEMENT: Pt presents to PT with reports of decrease in pain. Has been compliant with HEP.   EVAL: Patient reports neck stiffness and soreness for about the past 6 months, and sometime he gets a burning sensation in a specific spot. The burning sensation is located lower neck on right side. He also notes right shoulder pain for a good while that is getting  worse. He likes to work out and any overhead or bench press activity will cause terrible pain a few hours after his exercises. He also rides a road bike and he has trouble turning his neck to look behind him. He also notes that his sleep is disturbed due to his neck and shoulder, he has trouble getting comfortable. He does feel like he is limited with the right arm reaching behind his back and overhead. His pain seems to be worse with activity and not at any point during the day.   Hand dominance: Right  PERTINENT HISTORY:  See PMH above  PAIN:  Are you having pain? Yes:  NPRS scale: 2/10 at worst 5-6/10 Pain location: Neck Pain description: Stiff, sore, burning Aggravating factors: Turning neck, lifting, activity Relieving factors: Stretching  NPRS scale: 5/10 at worst 7-8/10 Pain location: Right shoulder Pain description: Throbbing Aggravating factors: Lifting, bench press Relieving factors: Rest, ice  PRECAUTIONS: None  RED FLAGS: None    WEIGHT BEARING RESTRICTIONS: No  FALLS:  Has patient fallen in last 6 months? No  OCCUPATION: No  PLOF: Independent  PATIENT GOALS: Reduce pain with working out   OBJECTIVE:  Note: Objective measures were completed at Evaluation unless otherwise noted. PATIENT SURVEYS:  FOTO 61% functional status  POSTURE:   Rounded shoulder and forward head posture  PALPATION: Tender to palpation right cervical paraspinals and upper  trap region, right LH biceps tendon region   CERVICAL ROM:   Active ROM A/PROM (deg) eval  Flexion 50  Extension 20  Right lateral flexion 15  Left lateral flexion 20  Right rotation 45  Left rotation 50   (Blank rows = not tested)  UPPER EXTREMITY ROM:  Active ROM Right eval Left eval  Shoulder flexion 140 140  Shoulder extension    Shoulder abduction    Shoulder adduction    Shoulder extension    Shoulder internal rotation T12 T12  Shoulder external rotation T2 T2  Elbow flexion    Elbow  extension    Wrist flexion    Wrist extension    Wrist ulnar deviation    Wrist radial deviation    Wrist pronation    Wrist supination     (Blank rows = not tested)   Eval: patient reports pain with all right shoulder movements  UPPER EXTREMITY MMT:  MMT Right eval Left eval  Shoulder flexion 4+ 5  Shoulder extension    Shoulder abduction 5 5  Shoulder adduction    Shoulder extension    Shoulder internal rotation 5 5  Shoulder external rotation 4 5  Middle trapezius 4- 4-  Lower trapezius 4- 4-  Elbow flexion    Elbow extension    Wrist flexion    Wrist extension    Wrist ulnar deviation    Wrist radial deviation    Wrist pronation    Wrist supination    Grip strength     (Blank rows = not tested)  CERVICAL SPECIAL TESTS:  Spurling's negative on right  FUNCTIONAL TESTS:  DNF endurance: 7 seconds   TREATMENT DATE:  [facility] Adult PT Treatment:                                                 Therapeutic Exercise: UBE L 2.0 x 4 min (fwd/bwd) while taking subjective Supine horizontal abd 2x15 blue band Sidelying thoracic rotation x 10 each Prone IYT 2x10 each Seated bilateral ER 2x10 blue band Seated low row 2x10 55# Seated lat pulldown 2x10 55# R shoulder ER 2x15 3# Manual Therapy: Skilled palpation and monitoring of muscle tension while performing TPDN Trigger Point Dry Needling:  Ongoing Treatment: Pt instructed on Dry Needling rational, procedures, and possible side effects. Pt instructed to expect mild to moderate muscle soreness later in the day and/or into the next day.  Pt instructed in methods to reduce muscle soreness. Pt instructed to continue prescribed HEP. Because Dry Needling was performed over or adjacent to a lung field, pt was educated on S/S of pneumothorax and to seek immediate medical attention should they occur.  Patient was educated on signs and symptoms of infection and other risk factors and advised to seek medical attention should they  occur.  Patient verbalized understanding of these instructions and education.   Patient Verbal Consent Given: Yes Education Handout Provided: Yes Muscles Treated: Right upper trap Electrical Stimulation Performed: No Treatment Response/Outcome: Twitch response, patient report improvement in symptoms  PATIENT EDUCATION:  Education details: HEP update Person educated: Patient Education method: Explanation, Demonstration, Tactile cues, Verbal cues, and Handouts Education comprehension: verbalized understanding, returned demonstration, verbal cues required, tactile cues required, and needs further education  HOME EXERCISE PROGRAM: Access Code: [email or web address] URL: [email or web address] Date: 11/18/2023 Prepared by: Edwinna Areola  Exercises -  Seated Neck Retraction  - 3-5 x daily - 10 reps - 5 seconds hold - Cervical Extension AROM with Strap  - 1 x daily - 3 sets - 10 reps - Seated Assisted Cervical Rotation with Towel  - 1 x daily - 3 sets - 10 reps - Shoulder External Rotation with Anchored Resistance  - 1 x daily - 3 sets - 15 reps - Shoulder Elbow Flexion 90/90 with Band  - 1 x daily - 3 sets - 10 reps - Prone I  - 1 x daily - 7 x weekly - 2 sets - 10 reps - Prone T  - 1 x daily - 7 x weekly - 2 sets - 10 reps - Prone Scapular Retraction Y  - 1 x daily - 7 x weekly - 2 sets - 10 reps   ASSESSMENT: CLINICAL IMPRESSION: Pt was able to complete all prescribed exercises with no adverse effect. He responded well to TPDN today, noting decreasing in muscle tension post session. Exercises today focused on continued periscpaular, DNF, and RTC strengthening. HEP updated for prone periscapular strengthening. Pt is progressing well overall, PT will continue to progress as able per POC.    EVAL: Patient is a 64 y.o. male who was seen today for physical therapy evaluation and treatment for chronic neck and right shoulder pain. He demonstrates limitations with his cervical mobility and  active motion, postural deficits with impaired DNF endurance and periscapular muscle strength, right rotator cuff strength deficit, and pain with active right shoulder motion.    OBJECTIVE IMPAIRMENTS: decreased activity tolerance, decreased ROM, decreased strength, postural dysfunction, and pain.   ACTIVITY LIMITATIONS: carrying, lifting, sleeping, and reach over head  PARTICIPATION LIMITATIONS: community activity  PERSONAL FACTORS: Time since onset of injury/illness/exacerbation are also affecting patient's functional outcome.    GOALS: Goals reviewed with patient? Yes  SHORT TERM GOALS: Target date: 11/03/2023  Patient will be I with initial HEP in order to progress with therapy. Baseline: HEP provided at eval Goal status: INITIAL  2.  Patient will report neck and right shoulder pain </= 4/10 with activities such as working out in order to reduce functional limitations Baseline: see pain level above Goal status: INITIAL  LONG TERM GOALS: Target date: 12/01/2023  Patient will be I with final HEP to maintain progress from PT. Baseline: HEP provided at eval Goal status: INITIAL  2.  Patient will report >/= 68% status on FOTO to indicate improved functional ability. Baseline: 61% functional status Goal status: INITIAL  3.  Patient will demonstrate cervical rotation >/= 55 deg in order to improve turning head while riding bike Baseline: see limitations above Goal status: INITIAL  4.  Patient will demonstrate DNF endurance >/= 30 sec in order to improve postural control and reduce pain with activity Baseline: 7 seconds Goal status: INITIAL   PLAN: PT FREQUENCY: 1x/week  PT DURATION: 8 weeks  PLANNED INTERVENTIONS: 97164- PT Re-evaluation, 97110-Therapeutic exercises, 97530- Therapeutic activity, 97112- Neuromuscular re-education, 97535- Self Care, 16109- Manual therapy, 97014- Electrical stimulation (unattended), [phone]- Electrical stimulation (manual), Patient/Family  education, Taping, Dry Needling, Joint mobilization, Joint manipulation, Spinal manipulation, Spinal mobilization, Cryotherapy, and Moist heat  PLAN FOR NEXT SESSION: Review HEP and progress PRN, manual/TPDN for right cervical and upper trap region, cervical and thoracic mobility, DNF endurance and postural strengthening, right rotator cuff strengthening   Eloy End PT  11/18/23 8:35 AM

## 2023-12-02 ENCOUNTER — Encounter: Payer: Self-pay | Admitting: Physical Therapy

## 2023-12-02 ENCOUNTER — Ambulatory Visit: Payer: Federal, State, Local not specified - PPO | Attending: Family Medicine | Admitting: Physical Therapy

## 2023-12-02 ENCOUNTER — Other Ambulatory Visit: Payer: Self-pay

## 2023-12-02 DIAGNOSIS — M6281 Muscle weakness (generalized): Secondary | ICD-10-CM | POA: Insufficient documentation

## 2023-12-02 DIAGNOSIS — M542 Cervicalgia: Secondary | ICD-10-CM | POA: Insufficient documentation

## 2023-12-02 NOTE — Therapy (Addendum)
 OUTPATIENT PHYSICAL THERAPY TREATMENT  DISCHARGE   Patient Name: Wesley King MRN: [id number] DOB:1960/11/27, 63 y.o., male Today's Date: 12/02/2023   END OF SESSION:  PT End of Session - 12/02/23 1146     Visit Number 5    Number of Visits 11    Date for PT Re-Evaluation 01/13/24    Authorization Type BCBS / [facility]    PT Start Time 1146    PT Stop Time 1228    PT Time Calculation (min) 42 min    Activity Tolerance Patient tolerated treatment well    Behavior During Therapy [facility] for tasks assessed/performed                Past Medical History:  Diagnosis Date   Hyperlipidemia    Testicular torsion    Past Surgical History:  Procedure Laterality Date   KNEE SURGERY     testicular torsion repair     Patient Active Problem List   Diagnosis Date Noted   Cervical radiculopathy 08/13/2023   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   Colonic polyp 12/26/2022   Dizziness 06/04/2022   Dyspnea on exertion 06/04/2022   Muscle cramps 06/04/2022   Other fatigue 06/04/2022   Osteoarthritis of left knee 10/27/2020   Chronic knee pain after total replacement of left knee joint 08/09/2020   BMI 36.0-36.9,adult 07/12/2019   Hematochezia 07/12/2019   Umbilical hernia 07/12/2019   Bilateral hearing loss 07/06/2018   Encounter for annual physical exam 06/05/2018   Elevated LDL cholesterol level 06/05/2018    PCP: Laneta Pintos, MD  REFERRING PROVIDER: Laneta Pintos, MD  REFERRING DIAG: Cervical radiculopathy  THERAPY DIAG:  Cervicalgia  Muscle weakness (generalized)  Rationale for Evaluation and Treatment: Rehabilitation  ONSET DATE: Chronic   SUBJECTIVE:          SUBJECTIVE STATEMENT: Patient reports he is doing pretty good, neck is feeling a whole lot better and shoulder is a little better.   EVAL: Patient reports neck stiffness and soreness for about the past 6 months, and sometime he gets a burning sensation in a specific spot. The burning sensation is  located lower neck on right side. He also notes right shoulder pain for a good while that is getting worse. He likes to work out and any overhead or bench press activity will cause terrible pain a few hours after his exercises. He also rides a road bike and he has trouble turning his neck to look behind him. He also notes that his sleep is disturbed due to his neck and shoulder, he has trouble getting comfortable. He does feel like he is limited with the right arm reaching behind his back and overhead. His pain seems to be worse with activity and not at any point during the day.   Hand dominance: Right  PERTINENT HISTORY:  See PMH above  PAIN:  Are you having pain? Yes:  NPRS scale: 1/10 at worst 3/10 Pain location: Neck Pain description: Stiff, sore, burning Aggravating factors: Turning neck, lifting, activity Relieving factors: Stretching  NPRS scale: 2/10 at worst 6/10 Pain location: Right shoulder Pain description: Throbbing Aggravating factors: Lifting, bench press Relieving factors: Rest, ice  PRECAUTIONS: None  RED FLAGS: None    WEIGHT BEARING RESTRICTIONS: No  FALLS:  Has patient fallen in last 6 months? No  OCCUPATION: No  PLOF: Independent  PATIENT GOALS: Reduce pain with working out   OBJECTIVE:  Note: Objective measures were completed at Evaluation unless otherwise noted. PATIENT SURVEYS:  FOTO 61% functional status  12/02/2023: 61%  POSTURE:   Rounded shoulder and forward head posture  PALPATION: Tender to palpation right cervical paraspinals and upper trap region, right LH biceps tendon region   CERVICAL ROM:   Active ROM A/PROM (deg) eval AROM 12/02/2023  Flexion 50   Extension 20   Right lateral flexion 15   Left lateral flexion 20   Right rotation 45 60  Left rotation 50 60   (Blank rows = not tested)  UPPER EXTREMITY ROM:  Active ROM Right eval Left eval Rt / Lt 12/02/2023  Shoulder flexion 140 140 140 / 140  Shoulder extension      Shoulder abduction     Shoulder adduction     Shoulder extension     Shoulder internal rotation T12 T12   Shoulder external rotation T2 T2   Elbow flexion     Elbow extension     Wrist flexion     Wrist extension     Wrist ulnar deviation     Wrist radial deviation     Wrist pronation     Wrist supination      (Blank rows = not tested)   Eval: patient reports pain with all right shoulder movements  UPPER EXTREMITY MMT:  MMT Right eval Left eval Rt / Lt 12/02/2023  Shoulder flexion 4+ 5 4+ / 5  Shoulder extension     Shoulder abduction 5 5   Shoulder adduction     Shoulder extension     Shoulder internal rotation 5 5   Shoulder external rotation 4 5 4+ / 5  Middle trapezius 4- 4-   Lower trapezius 4- 4-   Elbow flexion     Elbow extension     Wrist flexion     Wrist extension     Wrist ulnar deviation     Wrist radial deviation     Wrist pronation     Wrist supination     Grip strength      (Blank rows = not tested)  CERVICAL SPECIAL TESTS:  Spurling's negative on right  FUNCTIONAL TESTS:  DNF endurance: 7 seconds  12/02/2023: 22 sec   TREATMENT DATE:  [facility] Adult PT Treatment:                                                DATE: 12/02/2023 UBE L5 x 4 min (fwd/bwd) to improve endurance and workload capacity Right GHJ mobs primarily inferior and posterior at various ranges of elevation Right shoulder PROM all directions Seated banded W with green 2 x 10 Seated bent elbow shoulder flexion with green loop at wrists 2 x 10 Seated shoulder ER with green with elbows resting on bolster x 15 Supine single arm chest press with 15# KB bottom-up 2 x 10 with right   PATIENT EDUCATION:  Education details: HEP update Person educated: Patient Education method: Explanation, Demonstration, Tactile cues, Verbal cues, Handout Education comprehension: verbalized understanding, returned demonstration, verbal cues required, tactile cues required, and needs further  education  HOME EXERCISE PROGRAM: Access Code: [email or web address]   ASSESSMENT: CLINICAL IMPRESSION: Patient tolerated therapy well with no adverse effects. He reports improvement in his neck and right shoulder pain but this has not improved functional status on FOTO and he does continue to exhibit right rotator cuff strength deficits. He cervical motion and postural control  has improved compared to evaluation. Therapy focused on progressing his rotator cuff strength and control with more overhead movements. He did report some difficulty and anterior shoulder pain with supine chest press primarily with lower range lowering. His right shoulder pain does seem to be rotator cuff and bicep tendon related, updated his HEP to progress rotator cuff strengthening and control. Patient would benefit from continued skilled PT to progress his mobility and strength in order to reduce pain and maximize functional ability.   EVAL: Patient is a 63 y.o. male who was seen today for physical therapy evaluation and treatment for chronic neck and right shoulder pain. He demonstrates limitations with his cervical mobility and active motion, postural deficits with impaired DNF endurance and periscapular muscle strength, right rotator cuff strength deficit, and pain with active right shoulder motion.    OBJECTIVE IMPAIRMENTS: decreased activity tolerance, decreased ROM, decreased strength, postural dysfunction, and pain.   ACTIVITY LIMITATIONS: carrying, lifting, sleeping, and reach over head  PARTICIPATION LIMITATIONS: community activity  PERSONAL FACTORS: Time since onset of injury/illness/exacerbation are also affecting patient's functional outcome.    GOALS: Goals reviewed with patient? Yes  SHORT TERM GOALS: Target date: = LTG  Patient will be I with initial HEP in order to progress with therapy. Baseline: HEP provided at eval 12/02/2023: independent Goal status: MET  2.  Patient will report neck and right shoulder  pain </= 4/10 with activities such as working out in order to reduce functional limitations Baseline: see pain level above 12/02/2023: right shoulder pain 6/10 Goal status: ONGOING  LONG TERM GOALS: Target date: 01/13/2024  Patient will be I with final HEP to maintain progress from PT. Baseline: HEP provided at eval 12/02/2023: progressing Goal status: ONGOING  2.  Patient will report >/= 68% status on FOTO to indicate improved functional ability. Baseline: 61% functional status 12/02/2023: 61% Goal status: ONGOING  3.  Patient will demonstrate cervical rotation >/= 55 deg in order to improve turning head while riding bike Baseline: see limitations above 12/02/2023: 60 deg Goal status: MET  4.  Patient will demonstrate DNF endurance >/= 30 sec in order to improve postural control and reduce pain with activity Baseline: 7 seconds 12/02/2023: 22 sec Goal status: ONGOING   PLAN: PT FREQUENCY: 1x/week  PT DURATION: 8 weeks  PLANNED INTERVENTIONS: 97164- PT Re-evaluation, 97110-Therapeutic exercises, 97530- Therapeutic activity, 97112- Neuromuscular re-education, 97535- Self Care, 40981- Manual therapy, 97014- Electrical stimulation (unattended), [phone]- Electrical stimulation (manual), Patient/Family education, Taping, Dry Needling, Joint mobilization, Joint manipulation, Spinal manipulation, Spinal mobilization, Cryotherapy, and Moist heat  PLAN FOR NEXT SESSION: Review HEP and progress PRN, manual/TPDN for right cervical and upper trap region, cervical and thoracic mobility, DNF endurance and postural strengthening, right rotator cuff strengthening   Leah Primus, PT, DPT, LAT, ATC 12/02/23  1:05 PM Phone: [phone] Fax: [phone]   PHYSICAL THERAPY DISCHARGE SUMMARY  Visits from Start of Care: 5  Current functional level related to goals / functional outcomes: See above   Remaining deficits: See above   Education / Equipment: HEP   Patient agrees to discharge.  Patient goals were not met. Patient is being discharged due to not returning since the last visit.  Leah Primus, PT, DPT, LAT, ATC 01/30/24  8:52 AM Phone: [phone] Fax: [phone]

## 2023-12-02 NOTE — Patient Instructions (Signed)
 Access Code: [email or web address] URL: [email or web address] Date: 12/02/2023 Prepared by: Rosana Hoes  Exercises - Shoulder W - External Rotation with Resistance  - 1 x daily - 3 sets - 10 reps - Shoulder Elbow Flexion 90/90 with Band  - 1 x daily - 3 sets - 10 reps - Prone I  - 1 x daily - 7 x weekly - 2 sets - 10 reps - Prone T  - 1 x daily - 7 x weekly - 2 sets - 10 reps - Prone Scapular Retraction Y  - 1 x daily - 7 x weekly - 2 sets - 10 reps

## 2023-12-03 ENCOUNTER — Other Ambulatory Visit: Payer: Federal, State, Local not specified - PPO

## 2023-12-03 DIAGNOSIS — Z125 Encounter for screening for malignant neoplasm of prostate: Secondary | ICD-10-CM

## 2023-12-03 DIAGNOSIS — E669 Obesity, unspecified: Secondary | ICD-10-CM | POA: Diagnosis not present

## 2023-12-04 LAB — COMPREHENSIVE METABOLIC PANEL
ALT: 23 IU/L (ref 0–44)
AST: 38 IU/L (ref 0–40)
Albumin: 4.2 g/dL (ref 3.9–4.9)
Alkaline Phosphatase: 88 IU/L (ref 44–121)
BUN/Creatinine Ratio: 18 (ref 10–24)
BUN: 19 mg/dL (ref 8–27)
Bilirubin Total: 0.4 mg/dL (ref 0.0–1.2)
CO2: 21 mmol/L (ref 20–29)
Calcium: 9.3 mg/dL (ref 8.6–10.2)
Chloride: 108 mmol/L — ABNORMAL HIGH (ref 96–106)
Creatinine, Ser: 1.04 mg/dL (ref 0.76–1.27)
Globulin, Total: 2.8 g/dL (ref 1.5–4.5)
Glucose: 109 mg/dL — ABNORMAL HIGH (ref 70–99)
Potassium: 4.6 mmol/L (ref 3.5–5.2)
Sodium: 140 mmol/L (ref 134–144)
Total Protein: 7 g/dL (ref 6.0–8.5)
eGFR: 81 mL/min/{1.73_m2} (ref >59)

## 2023-12-04 LAB — PSA: Prostate Specific Ag, Serum: 1.4 ng/mL (ref 0.0–4.0)

## 2023-12-04 LAB — HEMOGLOBIN A1C
Est. average glucose Bld gHb Est-mCnc: 100 mg/dL
Hgb A1c MFr Bld: 5.1 % (ref 4.8–5.6)

## 2023-12-04 LAB — LIPID PANEL
Chol/HDL Ratio: 3.8 ratio (ref 0.0–5.0)
Cholesterol, Total: 188 mg/dL (ref 100–199)
HDL: 49 mg/dL (ref >39)
LDL Chol Calc (NIH): 123 mg/dL — ABNORMAL HIGH (ref 0–99)
Triglycerides: 88 mg/dL (ref 0–149)
VLDL Cholesterol Cal: 16 mg/dL (ref 5–40)

## 2023-12-09 ENCOUNTER — Ambulatory Visit: Payer: Federal, State, Local not specified - PPO | Admitting: Physical Therapy

## 2023-12-16 ENCOUNTER — Encounter: Payer: Federal, State, Local not specified - PPO | Admitting: Physical Therapy

## 2023-12-19 ENCOUNTER — Other Ambulatory Visit: Payer: Federal, State, Local not specified - PPO

## 2023-12-26 ENCOUNTER — Ambulatory Visit (INDEPENDENT_AMBULATORY_CARE_PROVIDER_SITE_OTHER): Payer: Federal, State, Local not specified - PPO | Admitting: Family Medicine

## 2023-12-26 ENCOUNTER — Encounter: Payer: Self-pay | Admitting: Family Medicine

## 2023-12-26 VITALS — BP 134/75 | HR 65 | Ht 74.0 in | Wt 271.0 lb

## 2023-12-26 DIAGNOSIS — G8929 Other chronic pain: Secondary | ICD-10-CM | POA: Diagnosis not present

## 2023-12-26 DIAGNOSIS — Z0000 Encounter for general adult medical examination without abnormal findings: Secondary | ICD-10-CM

## 2023-12-26 DIAGNOSIS — E7800 Pure hypercholesterolemia, unspecified: Secondary | ICD-10-CM | POA: Diagnosis not present

## 2023-12-26 DIAGNOSIS — M25561 Pain in right knee: Secondary | ICD-10-CM | POA: Diagnosis not present

## 2023-12-26 NOTE — Progress Notes (Signed)
   Annual physical  Subjective   Patient ID: PHI AVANS, male    DOB: 1961/02/13  Age: 63 y.o. MRN: [id number]  Chief Complaint  Patient presents with   Annual Exam   HPI Alaa is a 63 y.o. old male here  for annual exam.   Work:retiredpostal service Relationship: Married Children: Yes Tobacco: No Alcohol: 2/week Recreational drugs: No  Diet: Regular diet Exercise: Bicycle, pickleball, jump rope, weightlifting  Other providers: Delbert Harness  HPI  Separate, acute concerns today: Patient planing of right knee pain.  He had surgery on the right knee several years ago performed by the reader.  Feels like he overexerted himself when he started having jumping rope and pickleball to his exercise.  Feels like the knee is sore and stiff.  Will sometimes feel a popping and it.  Does not buckle.  We discussed the patient's labs including mildly elevated cholesterol that was similar to previous years.  Patient is doing physical therapy up until recently for his neck pain.  Stopped because he did not feel like he was making any progress with it.  Still has occasional neck pain.  Has had dry needling of this before.  The 10-year ASCVD risk score (Arnett DK, et al., 2019) is: 9.5%  Health Maintenance Due  Topic Date Due   Zoster Vaccines- Shingrix (2 of 2) 01/21/2022   INFLUENZA VACCINE  05/01/2023   COVID-19 Vaccine (4 - 2024-25 season) 06/01/2023      Objective:     BP 134/75   Pulse 65   Ht 6\' 2"  (1.88 m)   Wt 271 lb (122.9 kg)   SpO2 97%   BMI 34.79 kg/m    Physical Exam General: Alert, oriented H&T: PERRLA, EOMI, moist oral mucosa CV: Regular rate and rhythm. Pulmonary: Lungs clear bilaterally GI: Soft, normal bowel sounds MSK: No swelling of the knees.  No tenderness to palpation.  No crepitus.  Normal range of motion.   No results found for any visits on 12/26/23.      Assessment & Plan:   Physical exam, annual  Elevated LDL cholesterol  level Assessment & Plan: Stable mildly elevated LDL.  CAC score from 2023 was 0.  Holding off statins at this time.   Chronic pain of right knee Assessment & Plan: Would favor osteoarthritis.  Has had surgery on that knee before so recommended he discuss it with his orthopedist.  Recommended otc topical and oral pain relief.        Return in about 1 year (around 12/25/2024) for physical.    Sandre Kitty, MD

## 2023-12-26 NOTE — Assessment & Plan Note (Signed)
 Stable mildly elevated LDL.  CAC score from 2023 was 0.  Holding off statins at this time.

## 2023-12-26 NOTE — Patient Instructions (Signed)
 It was nice to see you today,  We addressed the following topics today: -For your knee I would recommend scheduling a visit with Delbert Harness.  You can also talk to them about your neck pain. - Things you can do until then include Tylenol 1000 mg every 8 hours as needed, naproxen as needed.  Topical over-the-counter medication is the safest.  This includes Voltaren gel, IcyHot, capsaicin gel.  Have a great day,  Frederic Jericho, MD

## 2023-12-26 NOTE — Assessment & Plan Note (Signed)
 Would favor osteoarthritis.  Has had surgery on that knee before so recommended he discuss it with his orthopedist.  Recommended otc topical and oral pain relief.

## 2023-12-30 DIAGNOSIS — M1711 Unilateral primary osteoarthritis, right knee: Secondary | ICD-10-CM | POA: Diagnosis not present

## 2024-01-28 DIAGNOSIS — M25561 Pain in right knee: Secondary | ICD-10-CM | POA: Diagnosis not present

## 2024-02-11 ENCOUNTER — Encounter (HOSPITAL_BASED_OUTPATIENT_CLINIC_OR_DEPARTMENT_OTHER): Payer: Self-pay | Admitting: Orthopedic Surgery

## 2024-02-11 ENCOUNTER — Other Ambulatory Visit: Payer: Self-pay

## 2024-02-11 DIAGNOSIS — S83241A Other tear of medial meniscus, current injury, right knee, initial encounter: Secondary | ICD-10-CM | POA: Diagnosis not present

## 2024-02-16 NOTE — H&P (Signed)
 PREOPERATIVE H&P  Chief Complaint: right knee pain  HPI: Wesley King is a 63 y.o. male who presents for preoperative history and physical with a diagnosis of Right knee medial meniscus tear. He has previously had a left knee arthroscopy and a right distal pole patella fracture open reduction and internal fixation in 2015. The right knee is really bothersome. He gets ice pick jabbing sharp pain on the medial aspect. He had a right knee scope done in college in the remote past. He was trying to play pickle ball which really aggravated the knee. This is significantly impairing activities of daily living.  He has elected for surgical management.   Past Medical History:  Diagnosis Date   Hyperlipidemia    Testicular torsion    Past Surgical History:  Procedure Laterality Date   KNEE SURGERY Right    testicular torsion repair     Social History   Socioeconomic History   Marital status: Married    Spouse name: Not on file   Number of children: Not on file   Years of education: Not on file   Highest education level: Some college, no degree  Occupational History   Not on file  Tobacco Use   Smoking status: Never   Smokeless tobacco: Never  Vaping Use   Vaping status: Never Used  Substance and Sexual Activity   Alcohol use: Yes    Alcohol/week: 1.0 standard drink of alcohol    Types: 1 Cans of beer per week    Comment: occasional   Drug use: Yes    Types: Marijuana    Comment: last used 75mo ago   Sexual activity: Yes    Birth control/protection: Surgical  Other Topics Concern   Not on file  Social History Narrative   Not on file   Social Drivers of Health   Financial Resource Strain: Low Risk  (08/12/2023)   Overall Financial Resource Strain (CARDIA)    Difficulty of Paying Living Expenses: Not very hard  Food Insecurity: No Food Insecurity (08/12/2023)   Hunger Vital Sign    Worried About Running Out of Food in the Last Year: Never true    Ran Out of Food in the  Last Year: Never true  Transportation Needs: No Transportation Needs (08/12/2023)   PRAPARE - Administrator, Civil Service (Medical): No    Lack of Transportation (Non-Medical): No  Physical Activity: Sufficiently Active (08/12/2023)   Exercise Vital Sign    Days of Exercise per Week: 5 days    Minutes of Exercise per Session: 60 min  Stress: No Stress Concern Present (08/12/2023)   [employer] of Occupational Health - Occupational Stress Questionnaire    Feeling of Stress : Not at all  Social Connections: Moderately Integrated (08/12/2023)   Social Connection and Isolation Panel [NHANES]    Frequency of Communication with Friends and Family: Three times a week    Frequency of Social Gatherings with Friends and Family: Once a week    Attends Religious Services: 1 to 4 times per year    Active Member of [employer] or Organizations: No    Attends Engineer, structural: Not on file    Marital Status: Married   Family History  Problem Relation Age of Onset   Diabetes Father    Healthy Sister    Diabetes Brother    Healthy Brother    Healthy Son    Healthy Son    Colon cancer Neg Hx  Esophageal cancer Neg Hx    Stomach cancer Neg Hx    Rectal cancer Neg Hx    No Known Allergies Prior to Admission medications   Medication Sig Start Date End Date Taking? Authorizing Provider  Cholecalciferol (VITAMIN D  PO) Take 1 tablet by mouth daily.   Yes [provider]  Multiple Vitamin (MULTIVITAMIN WITH MINERALS) TABS tablet Take 1 tablet by mouth daily.   Yes [provider]  Omega-3 Fatty Acids (FISH OIL PO) Take 1 tablet by mouth daily.   Yes [provider]  Zinc Sulfate (ZINC 15 PO) Take by mouth.   Yes [provider]     Positive ROS: All other systems have been reviewed and were otherwise negative with the exception of those mentioned in the HPI and as above.  Physical Exam: General: Alert, no acute  distress Cardiovascular: No pedal edema Respiratory: No cyanosis, no use of accessory musculature GI: No organomegaly, abdomen is soft and non-tender Skin: No lesions in the area of chief complaint Neurologic: Sensation intact distally Psychiatric: Patient is competent for consent with normal mood and affect Lymphatic: No axillary or cervical lymphadenopathy  MUSCULOSKELETAL: The surgical wounds have healed well over the right knee. He has a full straight leg raise. Extensor mechanism is intact. No extensor lag. He has medial joint line tenderness. Ligamentously feels stable.    X-rays and MRI of the right knee are reviewed. 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  The MRI demonstrates medial bone marrow edema, as well as a meniscal tear.   Assessment: Right knee medial meniscus tear with a history of a distal patella fracture, non-union, open reduction and internal fixation.   Plan: Plan for Procedure(s): ARTHROSCOPY, KNEE, WITH MEDIAL MENISCECTOMY  The risks benefits and alternatives were discussed with the patient including but not limited to the risks of nonoperative treatment, versus surgical intervention including infection, bleeding, nerve injury,  blood clots, cardiopulmonary complications, morbidity, mortality, among others, and they were willing to proceed.   Abraham Hoffmann, PA-C    02/16/2024 12:49 PM

## 2024-02-17 ENCOUNTER — Ambulatory Visit (HOSPITAL_BASED_OUTPATIENT_CLINIC_OR_DEPARTMENT_OTHER): Payer: Self-pay | Admitting: Anesthesiology

## 2024-02-17 ENCOUNTER — Encounter (HOSPITAL_BASED_OUTPATIENT_CLINIC_OR_DEPARTMENT_OTHER)
Admission: RE | Disposition: A | Discharge status: Home / Self Care | Payer: Self-pay | Source: Home / Self Care | Attending: Orthopedic Surgery

## 2024-02-17 ENCOUNTER — Other Ambulatory Visit: Payer: Self-pay

## 2024-02-17 ENCOUNTER — Encounter (HOSPITAL_BASED_OUTPATIENT_CLINIC_OR_DEPARTMENT_OTHER): Payer: Self-pay | Admitting: Orthopedic Surgery

## 2024-02-17 ENCOUNTER — Ambulatory Visit (HOSPITAL_BASED_OUTPATIENT_CLINIC_OR_DEPARTMENT_OTHER)
Admission: RE | Admit: 2024-02-17 | Discharge: 2024-02-17 | Disposition: A | Discharge status: Home / Self Care | Attending: Orthopedic Surgery | Admitting: Orthopedic Surgery

## 2024-02-17 DIAGNOSIS — M5412 Radiculopathy, cervical region: Secondary | ICD-10-CM | POA: Diagnosis not present

## 2024-02-17 DIAGNOSIS — Z01818 Encounter for other preprocedural examination: Secondary | ICD-10-CM

## 2024-02-17 DIAGNOSIS — X58XXXA Exposure to other specified factors, initial encounter: Secondary | ICD-10-CM | POA: Insufficient documentation

## 2024-02-17 DIAGNOSIS — S83241A Other tear of medial meniscus, current injury, right knee, initial encounter: Secondary | ICD-10-CM | POA: Diagnosis not present

## 2024-02-17 HISTORY — PX: KNEE ARTHROSCOPY WITH MEDIAL MENISECTOMY: SHX5651

## 2024-02-17 SURGERY — ARTHROSCOPY, KNEE, WITH MEDIAL MENISCECTOMY
Anesthesia: General | Site: Knee | Laterality: Right

## 2024-02-17 MED ORDER — DEXAMETHASONE SODIUM PHOSPHATE 10 MG/ML IJ SOLN
INTRAMUSCULAR | Status: DC | PRN
  Administered 2024-02-17: 5 mg via INTRAVENOUS

## 2024-02-17 MED ORDER — KETOROLAC TROMETHAMINE 30 MG/ML IJ SOLN
INTRAMUSCULAR | Status: DC | PRN
  Administered 2024-02-17: 30 mg via INTRAVENOUS

## 2024-02-17 MED ORDER — MIDAZOLAM HCL 2 MG/2ML IJ SOLN
INTRAMUSCULAR | Status: DC | PRN
  Administered 2024-02-17: 2 mg via INTRAVENOUS

## 2024-02-17 MED ORDER — ACETAMINOPHEN 500 MG PO TABS
ORAL_TABLET | ORAL | Status: AC
  Filled 2024-02-17: qty 2

## 2024-02-17 MED ORDER — FENTANYL CITRATE (PF) 100 MCG/2ML IJ SOLN
INTRAMUSCULAR | Status: DC | PRN
  Administered 2024-02-17: 50 ug via INTRAVENOUS
  Administered 2024-02-17: 100 ug via INTRAVENOUS
  Administered 2024-02-17: 50 ug via INTRAVENOUS

## 2024-02-17 MED ORDER — BUPIVACAINE HCL (PF) 0.25 % IJ SOLN
INTRAMUSCULAR | Status: DC | PRN
  Administered 2024-02-17: 20 mL

## 2024-02-17 MED ORDER — POVIDONE-IODINE 10 % EX SWAB: 2.0000 | Freq: Once | CUTANEOUS | Status: DC

## 2024-02-17 MED ORDER — DEXMEDETOMIDINE HCL IN NACL 80 MCG/20ML IV SOLN
INTRAVENOUS | Status: DC | PRN
  Administered 2024-02-17: 12 ug via INTRAVENOUS

## 2024-02-17 MED ORDER — CEFAZOLIN SODIUM-DEXTROSE 3-4 GM/150ML-% IV SOLN: 3.0000 g | INTRAVENOUS | Status: DC

## 2024-02-17 MED ORDER — AMISULPRIDE (ANTIEMETIC) 5 MG/2ML IV SOLN: 10.0000 mg | Freq: Once | INTRAVENOUS | Status: DC | PRN

## 2024-02-17 MED ORDER — BUPIVACAINE HCL (PF) 0.25 % IJ SOLN
INTRAMUSCULAR | Status: AC
  Filled 2024-02-17: qty 30

## 2024-02-17 MED ORDER — PROPOFOL 10 MG/ML IV BOLUS
INTRAVENOUS | Status: DC | PRN
  Administered 2024-02-17: 200 mg via INTRAVENOUS

## 2024-02-17 MED ORDER — ACETAMINOPHEN 500 MG PO TABS
1000.0000 mg | ORAL_TABLET | Freq: Once | ORAL | Status: AC
  Administered 2024-02-17: 1000 mg via ORAL

## 2024-02-17 MED ORDER — FENTANYL CITRATE (PF) 100 MCG/2ML IJ SOLN
INTRAMUSCULAR | Status: AC
  Filled 2024-02-17: qty 2

## 2024-02-17 MED ORDER — HYDROCODONE-ACETAMINOPHEN 5-325 MG PO TABS: 1.0000 | ORAL_TABLET | Freq: Four times a day (QID) | ORAL | 0 refills | Status: DC | PRN

## 2024-02-17 MED ORDER — FENTANYL CITRATE (PF) 100 MCG/2ML IJ SOLN: 25.0000 ug | INTRAMUSCULAR | Status: DC | PRN

## 2024-02-17 MED ORDER — POVIDONE-IODINE 7.5 % EX SOLN
Freq: Once | CUTANEOUS | Status: DC
  Filled 2024-02-17: qty 118

## 2024-02-17 MED ORDER — LIDOCAINE HCL (CARDIAC) PF 100 MG/5ML IV SOSY
PREFILLED_SYRINGE | INTRAVENOUS | Status: DC | PRN
  Administered 2024-02-17: 100 mg via INTRAVENOUS

## 2024-02-17 MED ORDER — PROPOFOL 10 MG/ML IV BOLUS
INTRAVENOUS | Status: AC
  Filled 2024-02-17: qty 20

## 2024-02-17 MED ORDER — CEFAZOLIN SODIUM-DEXTROSE 2-4 GM/100ML-% IV SOLN
INTRAVENOUS | Status: AC
  Filled 2024-02-17: qty 100

## 2024-02-17 MED ORDER — MIDAZOLAM HCL 2 MG/2ML IJ SOLN
INTRAMUSCULAR | Status: AC
  Filled 2024-02-17: qty 2

## 2024-02-17 MED ORDER — OXYCODONE HCL 5 MG PO TABS: 5.0000 mg | ORAL_TABLET | Freq: Once | ORAL | Status: DC | PRN

## 2024-02-17 MED ORDER — CEFAZOLIN SODIUM-DEXTROSE 2-3 GM-%(50ML) IV SOLR
INTRAVENOUS | Status: DC | PRN
  Administered 2024-02-17: 2 g via INTRAVENOUS

## 2024-02-17 MED ORDER — ONDANSETRON HCL 4 MG/2ML IJ SOLN
INTRAMUSCULAR | Status: DC | PRN
  Administered 2024-02-17: 4 mg via INTRAVENOUS

## 2024-02-17 MED ORDER — LACTATED RINGERS IV SOLN: INTRAVENOUS | Status: DC

## 2024-02-17 MED ORDER — SODIUM CHLORIDE 0.9 % IR SOLN
Status: DC | PRN
  Administered 2024-02-17: 3000 mL

## 2024-02-17 MED ORDER — OXYCODONE HCL 5 MG/5ML PO SOLN: 5.0000 mg | Freq: Once | ORAL | Status: DC | PRN

## 2024-02-17 MED ORDER — LACTATED RINGERS IV SOLN: INTRAVENOUS | Status: DC | PRN

## 2024-02-17 MED ORDER — LIDOCAINE 2% (20 MG/ML) 5 ML SYRINGE
INTRAMUSCULAR | Status: AC
  Filled 2024-02-17: qty 5

## 2024-02-17 SURGICAL SUPPLY — 27 items
BNDG ELASTIC 6INX 5YD STR LF (GAUZE/BANDAGES/DRESSINGS) ×2 IMPLANT
CLSR STERI-STRIP ANTIMIC 1/2X4 (GAUZE/BANDAGES/DRESSINGS) ×2 IMPLANT
DISSECTOR 3.8MM X 13CM (MISCELLANEOUS) ×2 IMPLANT
DISSECTOR 4.0MM X 13CM (MISCELLANEOUS) IMPLANT
DRAPE ARTHROSCOPY W/POUCH 90 (DRAPES) ×2 IMPLANT
DRAPE IMP U-DRAPE 54X76 (DRAPES) ×2 IMPLANT
DURAPREP 26ML APPLICATOR (WOUND CARE) ×2 IMPLANT
ELECTRODE REM PT RTRN 9FT ADLT (ELECTROSURGICAL) IMPLANT
GAUZE SPONGE 4X4 12PLY STRL (GAUZE/BANDAGES/DRESSINGS) ×2 IMPLANT
GLOVE BIO SURGEON STRL SZ7 (GLOVE) ×2 IMPLANT
GLOVE BIOGEL PI IND STRL 7.0 (GLOVE) ×2 IMPLANT
GLOVE BIOGEL PI IND STRL 8 (GLOVE) ×4 IMPLANT
GLOVE ORTHO TXT STRL SZ7.5 (GLOVE) ×2 IMPLANT
GOWN STRL REUS W/ TWL LRG LVL3 (GOWN DISPOSABLE) ×2 IMPLANT
GOWN STRL REUS W/ TWL XL LVL3 (GOWN DISPOSABLE) ×4 IMPLANT
MANIFOLD NEPTUNE II (INSTRUMENTS) ×2 IMPLANT
PACK ARTHROSCOPY DSU (CUSTOM PROCEDURE TRAY) ×2 IMPLANT
PACK BASIN DAY SURGERY FS (CUSTOM PROCEDURE TRAY) ×2 IMPLANT
PENCIL SMOKE EVACUATOR (MISCELLANEOUS) IMPLANT
SLEEVE SCD COMPRESS KNEE MED (STOCKING) IMPLANT
SOL .9 NS 3000ML IRR UROMATIC (IV SOLUTION) ×4 IMPLANT
SUT MNCRL AB 4-0 PS2 18 (SUTURE) ×2 IMPLANT
TOWEL GREEN STERILE FF (TOWEL DISPOSABLE) ×2 IMPLANT
TUBING ARTHROSCOPY IRRIG 16FT (MISCELLANEOUS) ×2 IMPLANT
WAND ABLATOR APOLLO I90 (BUR) IMPLANT
WATER STERILE IRR 1000ML POUR (IV SOLUTION) ×2 IMPLANT
WRAP KNEE MAXI GEL POST OP (GAUZE/BANDAGES/DRESSINGS) ×2 IMPLANT

## 2024-02-17 NOTE — Transfer of Care (Signed)
 Immediate Anesthesia Transfer of Care Note  Patient: Wesley King  Procedure(s) Performed: ARTHROSCOPY, KNEE, WITH MEDIAL MENISCECTOMY (Right: Knee)  Patient Location: PACU  Anesthesia Type:General  Level of Consciousness: awake, alert , and oriented  Airway & Oxygen Therapy: Patient Spontanous Breathing and Patient connected to face mask oxygen  Post-op Assessment: Report given to RN and Post -op Vital signs reviewed and stable  Post vital signs: Reviewed and stable  Last Vitals:  Vitals Value Taken Time  BP 134/96 02/17/24 1400  Temp    Pulse 70 02/17/24 1402  Resp 15 02/17/24 1402  SpO2 96 % 02/17/24 1402  Vitals shown include unfiled device data.  Last Pain:  Vitals:   02/17/24 1036  TempSrc: Temporal  PainSc: 0-No pain      Patients Stated Pain Goal: 3 (02/17/24 1036)  Complications: No notable events documented.

## 2024-02-17 NOTE — Discharge Instructions (Addendum)
 Diet: Start with some clear liquids, soups, etc, and advance to your regular diet as tolerated.   Dressing:  You may remove your dressing 3-5 days after surgery and shower.  There are steri-strips (white strips) over the incisions.  Your stitches are absorbable.  Leave the steri-strips in place when changing your dressings, they will peel off with time, usually 2-3 weeks.  Keep your wounds covered with band-aids/gauze until your first post-op appointment.  Activity:  Increase activity slowly as tolerated, but follow the weight bearing instructions below.  You cannot drive while taking narcotics.    Weight Bearing:   As tolerated, unless otherwise instructed.  If you had a femoral nerve block, use crutches and your knee immobilizer until the nerve block wears off (usually 12-24 hrs).  After the block has worn off, you may remove the knee immobilizer as soon as you feel that your leg can support you without the brace.    Medications:  You will want to take some of your pain medications tonight before going to bed to make sure you have something in your system when the numbing medicine/block wears off.  The maximum dose of Tylenol /Acetaminophen  in a day is 3,000-4,000 mg, and beware that your pain medication may have Tylenol  (acetaminophen ) in it.  As your pain improves you can begin to taper the amount of narcotic you are using.  You can also use ibuprofen/motrin/NSAIDs.  To prevent constipation: you may use a stool softener such as - Colace (over the counter) 100 mg by mouth twice a day, Drink plenty of fluids (prune juice may be helpful) and high fiber foods, Miralax (over the counter) for constipation as needed.    Itching:  If you experience itching or other side effects with your pain medications, try taking only a single pain pill, or even half a pain pill at a time.  You can use benadryl for itching or also to help with sleep.   Precautions:  If you experience chest pain or shortness of breath -  call 911 immediately for transfer to the hospital emergency department!!  If you develop a fever greater that 101 F, purulent drainage from wound, increased redness or drainage from wound, or calf pain -- Call the office at [phone]                                                 Follow- Up Appointment:  Please call for an appointment to be seen in 2 weeks [phone] in [place].    After-Hours:  We have an Urgent Care available for after-hours emergencies located at the [facility] office at [facility] in [place] open from 5:30p-9p every night, and from 10a-2p on Saturday and Sunday.  There is also an on call provider after fours available for urgent questions that can be reached at [phone]     No Tylenol  until after 4:40pm today, if needed. No Ibuprofen until after 7:45pm today, if needed.   Post Anesthesia Home Care Instructions  Activity: Get plenty of rest for the remainder of the day. A responsible individual must stay with you for 24 hours following the procedure.  For the next 24 hours, DO NOT: -Drive a car -Advertising copywriter -Drink alcoholic beverages -Take any medication unless instructed by your physician -Make any legal decisions or sign important papers.  Meals: Start with  liquid foods such as gelatin or soup. Progress to regular foods as tolerated. Avoid greasy, spicy, heavy foods. If nausea and/or vomiting occur, drink only clear liquids until the nausea and/or vomiting subsides. Call your physician if vomiting continues.  Special Instructions/Symptoms: Your throat may feel dry or sore from the anesthesia or the breathing tube placed in your throat during surgery. If this causes discomfort, gargle with warm salt water. The discomfort should disappear within 24 hours.  If you had a scopolamine patch placed behind your ear for the management of post- operative nausea and/or vomiting:  1. The medication in the patch is effective for 72 hours,  after which it should be removed.  Wrap patch in a tissue and discard in the trash. Wash hands thoroughly with soap and water. 2. You may remove the patch earlier than 72 hours if you experience unpleasant side effects which may include dry mouth, dizziness or visual disturbances. 3. Avoid touching the patch. Wash your hands with soap and water after contact with the patch.

## 2024-02-17 NOTE — Anesthesia Procedure Notes (Signed)
 Procedure Name: LMA Insertion Date/Time: 02/17/2024 1:27 PM  Performed by: Raymona Caldwell, CRNAPre-anesthesia Checklist: Patient identified, Emergency Drugs available, Suction available and Patient being monitored Patient Re-evaluated:Patient Re-evaluated prior to induction Oxygen Delivery Method: Circle system utilized Preoxygenation: Pre-oxygenation with 100% oxygen Induction Type: IV induction Ventilation: Mask ventilation without difficulty LMA: LMA inserted LMA Size: 5.0 Number of attempts: 1 Airway Equipment and Method: Bite block Placement Confirmation: positive ETCO2, CO2 detector and breath sounds checked- equal and bilateral Tube secured with: Tape Dental Injury: Teeth and Oropharynx as per pre-operative assessment

## 2024-02-17 NOTE — Anesthesia Postprocedure Evaluation (Signed)
 Anesthesia Post Note  Patient: Wesley King  Procedure(s) Performed: ARTHROSCOPY, KNEE, WITH MEDIAL MENISCECTOMY (Right: Knee)     Patient location during evaluation: PACU Anesthesia Type: General Level of consciousness: awake Pain management: pain level controlled Vital Signs Assessment: post-procedure vital signs reviewed and stable Respiratory status: spontaneous breathing, nonlabored ventilation and respiratory function stable Cardiovascular status: blood pressure returned to baseline and stable Postop Assessment: no apparent nausea or vomiting Anesthetic complications: no   No notable events documented.  Last Vitals:  Vitals:   02/17/24 1415 02/17/24 1430  BP: (!) 139/94 (!) 144/95  Pulse: 64 64  Resp: 13 12  Temp:    SpO2: 94% 95%    Last Pain:  Vitals:   02/17/24 1430  TempSrc:   PainSc: 0-No pain                 Conard Decent

## 2024-02-17 NOTE — Anesthesia Preprocedure Evaluation (Addendum)
 Anesthesia Evaluation  Patient identified by MRN, date of birth, ID band Patient awake    Reviewed: Allergy & Precautions, NPO status , Patient's Chart, lab work & pertinent test results  History of Anesthesia Complications Negative for: history of anesthetic complications  Airway Mallampati: III  TM Distance: >3 FB Neck ROM: Full    Dental  (+) Dental Advisory Given   Pulmonary neg pulmonary ROS   Pulmonary exam normal breath sounds clear to auscultation       Cardiovascular (-) hypertension(-) angina (-) Past MI, (-) Cardiac Stents and (-) CABG + dysrhythmias (PSVT, PACs/PVCs, 1st degree AV block)  Rhythm:Regular Rate:Normal  HLD  TTE 07/17/2022: IMPRESSIONS    1. Left ventricular ejection fraction, by estimation, is 55 to 60%. Left  ventricular ejection fraction by 2D MOD biplane is 58.5 %. The left  ventricle has normal function. The left ventricle has no regional wall  motion abnormalities. There is moderate  concentric left ventricular hypertrophy. Left ventricular diastolic  parameters were normal.   2. Right ventricular systolic function is normal. The right ventricular  size is normal. Tricuspid regurgitation signal is inadequate for assessing  PA pressure.   3. The mitral valve is grossly normal. Trivial mitral valve  regurgitation. No evidence of mitral stenosis.   4. The aortic valve is tricuspid. Aortic valve regurgitation is not  visualized. No aortic stenosis is present.   5. The inferior vena cava is normal in size with greater than 50%  respiratory variability, suggesting right atrial pressure of 3 mmHg.     Neuro/Psych neg Seizures  Neuromuscular disease (cervical radiculopathy)    GI/Hepatic negative GI ROS, Neg liver ROS,,,  Endo/Other  negative endocrine ROS    Renal/GU negative Renal ROS     Musculoskeletal  (+) Arthritis , Osteoarthritis,    Abdominal  (+) + obese  Peds   Hematology negative hematology ROS (+)   Anesthesia Other Findings   Reproductive/Obstetrics                             Anesthesia Physical Anesthesia Plan  ASA: 2  Anesthesia Plan: General   Post-op Pain Management: Tylenol  PO (pre-op)*   Induction: Intravenous  PONV Risk Score and Plan: 2 and Ondansetron, Dexamethasone, Midazolam and Treatment may vary due to age or medical condition  Airway Management Planned: LMA  Additional Equipment:   Intra-op Plan:   Post-operative Plan: Extubation in OR  Informed Consent: I have reviewed the patients History and Physical, chart, labs and discussed the procedure including the risks, benefits and alternatives for the proposed anesthesia with the patient or authorized representative who has indicated his/her understanding and acceptance.     Dental advisory given  Plan Discussed with: CRNA and Anesthesiologist  Anesthesia Plan Comments: (Risks of general anesthesia discussed including, but not limited to, sore throat, hoarse voice, chipped/damaged teeth, injury to vocal cords, nausea and vomiting, allergic reactions, lung infection, heart attack, stroke, and death. All questions answered. )        Anesthesia Quick Evaluation

## 2024-02-17 NOTE — Op Note (Signed)
 02/17/2024  1:49 PM  PATIENT:  Wesley King    PRE-OPERATIVE DIAGNOSIS:  medial meniscus tear of right knee  POST-OPERATIVE DIAGNOSIS:  Same  PROCEDURE:  ARTHROSCOPY, KNEE, WITH MEDIAL MENISCECTOMY  SURGEON:  Neville Barbone, MD  PHYSICIAN ASSISTANT: Hurshel Maidens, PA-C, present and scrubbed throughout the case, critical for completion in a timely fashion, and for retraction, instrumentation, and closure.  ANESTHESIA:   General  PREOPERATIVE INDICATIONS:  Wesley King is a  63 y.o. male with a diagnosis of medial meniscus tear of right knee who failed conservative measures and elected for surgical management.  He had a previous distal pole patella tendon rupture with repair.  The risks benefits and alternatives were discussed with the patient preoperatively including but not limited to the risks of infection, bleeding, nerve injury, cardiopulmonary complications, the need for revision surgery, among others, and the patient was willing to proceed.  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  The lateral compartment was in good condition, just a little bit of fraying of the meniscus anteriorly, but nothing structural.  The ACL was intact although he did have a small osteophyte that was making the notch a little bit more narrow than would be typical.  Medial meniscus had a complex tear in the body with a parrot-beak type to a stable configuration.  The articular cartilage on the medial side had some grade 1 changes.  OPERATIVE PROCEDURE: The patient was brought to the operating room and placed in the position.  General anesthesia was administered.  IV antibiotics were given.  Timeout was performed.  Diagnostic arthroscopy was carried out the above-named  findings.  The lateral portal had a fair amount of scar tissue, making it difficult to appreciate the soft spot, but I did have appropriate position.  I did use vertical portals given the difficulty in appreciating the soft spot.  After completing the diagnostic arthroscopy I used the arthroscopic shaver to debride the medial meniscus back to a stable configuration.  It was carried out.  The instruments were removed, the knee was drained, injected, and the portals closed with Monocryl followed by Steri-Strips and sterile gauze.  He was awakened and returned to the PACU in stable and satisfactory condition.  There were no complications and he tolerated the procedure well.

## 2024-02-17 NOTE — Interval H&P Note (Signed)
 History and Physical Interval Note:  02/17/2024 11:01 AM  Wesley King  has presented today for surgery, with the diagnosis of medial meniscus tear of right knee.  The various methods of treatment have been discussed with the patient and family. After consideration of risks, benefits and other options for treatment, the patient has consented to  Procedure(s): ARTHROSCOPY, KNEE, WITH MEDIAL MENISCECTOMY (Right) as a surgical intervention.  The patient's history has been reviewed, patient examined, no change in status, stable for surgery.  I have reviewed the patient's chart and labs.  Questions were answered to the patient's satisfaction.     Neville Barbone

## 2024-02-18 ENCOUNTER — Encounter (HOSPITAL_BASED_OUTPATIENT_CLINIC_OR_DEPARTMENT_OTHER): Payer: Self-pay | Admitting: Orthopedic Surgery

## 2024-03-03 DIAGNOSIS — S83241D Other tear of medial meniscus, current injury, right knee, subsequent encounter: Secondary | ICD-10-CM | POA: Diagnosis not present

## 2024-03-21 ENCOUNTER — Other Ambulatory Visit: Payer: Self-pay

## 2024-03-21 ENCOUNTER — Encounter (HOSPITAL_COMMUNITY): Payer: Self-pay

## 2024-03-21 ENCOUNTER — Emergency Department (HOSPITAL_COMMUNITY)
Admission: EM | Admit: 2024-03-21 | Discharge: 2024-03-22 | Discharge status: Against Medical Advice | Attending: Family Medicine | Admitting: Family Medicine

## 2024-03-21 DIAGNOSIS — Z5321 Procedure and treatment not carried out due to patient leaving prior to being seen by health care provider: Secondary | ICD-10-CM | POA: Diagnosis not present

## 2024-03-21 DIAGNOSIS — J029 Acute pharyngitis, unspecified: Secondary | ICD-10-CM | POA: Insufficient documentation

## 2024-03-21 NOTE — ED Triage Notes (Signed)
 Complaining of a sore throat and no energy that started Saturday evening. Said it hurts to swallow.

## 2024-03-22 ENCOUNTER — Ambulatory Visit (INDEPENDENT_AMBULATORY_CARE_PROVIDER_SITE_OTHER)
Admission: EM | Admit: 2024-03-22 | Discharge: 2024-03-22 | Disposition: A | Discharge status: Home / Self Care | Source: Home / Self Care | Attending: Family Medicine | Admitting: Family Medicine

## 2024-03-22 ENCOUNTER — Encounter: Payer: Self-pay | Admitting: Emergency Medicine

## 2024-03-22 DIAGNOSIS — J029 Acute pharyngitis, unspecified: Secondary | ICD-10-CM | POA: Diagnosis not present

## 2024-03-22 LAB — GROUP A STREP BY PCR: Group A Strep by PCR: NOT DETECTED

## 2024-03-22 LAB — POCT MONO SCREEN (KUC): Mono, POC: NEGATIVE

## 2024-03-22 LAB — POCT RAPID STREP A (OFFICE): Rapid Strep A Screen: NEGATIVE

## 2024-03-22 MED ORDER — KETOROLAC TROMETHAMINE 10 MG PO TABS: 10.0000 mg | ORAL_TABLET | Freq: Four times a day (QID) | ORAL | 0 refills | Status: AC | PRN

## 2024-03-22 MED ORDER — KETOROLAC TROMETHAMINE 30 MG/ML IJ SOLN
30.0000 mg | Freq: Once | INTRAMUSCULAR | Status: AC
  Administered 2024-03-22: 30 mg via INTRAMUSCULAR

## 2024-03-22 NOTE — Discharge Instructions (Signed)
 The monotest is negative  Your strep test is negative.  Culture of the throat will be sent, and staff will notify you if that is in turn positive.  Results will also go to your MyChart. This is most likely some other virus causing your symptoms.  You have been given a shot of Toradol  30 mg today.  Ketorolac  10 mg tablets--take 1 tablet every 6 hours as needed for pain.  This is the same medicine that is in the shot we just gave you

## 2024-03-22 NOTE — ED Triage Notes (Signed)
 Pt c/o sore throat since Sat.  Pt st's very painful to swallow.  Unable to eat due to pain

## 2024-03-22 NOTE — ED Notes (Signed)
 Pt stated that our wait time is too long and he has somewhere to be. Pt seen leaving the ED.

## 2024-03-22 NOTE — ED Provider Notes (Signed)
 [facility]    CSN: [id number] Arrival date & time: 03/22/24  1000      History   Chief Complaint Chief Complaint  Patient presents with   Sore Throat    HPI Wesley King is a 63 y.o. male.    Sore Throat  Here for sore throat that has been bothering him since June 21.  He does not have any cough or nasal congestion or rhinorrhea.  No shortness of breath and no ear pain.  No nausea or vomiting or diarrhea. He has not noted any fever or chills  No known exposures  NKDA  Last EGFR earlier this year was 81.   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   Colonic polyp 12/26/2022   Dizziness 06/04/2022   Dyspnea on exertion 06/04/2022   Muscle cramps 06/04/2022   Other fatigue 06/04/2022   Osteoarthritis of left knee 10/27/2020   Chronic knee pain after total replacement of left knee joint 08/09/2020   BMI 36.0-36.9,adult 07/12/2019   Hematochezia 07/12/2019   Umbilical hernia 07/12/2019   Bilateral hearing loss 07/06/2018   Encounter for annual physical exam 06/05/2018   Elevated LDL cholesterol level 06/05/2018    Past Surgical History:  Procedure Laterality Date   KNEE ARTHROSCOPY WITH MEDIAL MENISECTOMY Right 02/17/2024   Procedure: ARTHROSCOPY, KNEE, WITH MEDIAL MENISCECTOMY;  Surgeon: Josefina Chew, MD;  Location: [facility];  Service: Orthopedics;  Laterality: Right;   KNEE SURGERY Right    testicular torsion repair         Home Medications    Prior to Admission medications   Medication Sig Start Date End Date Taking? Authorizing Provider  ketorolac  (TORADOL ) 10 MG tablet Take 1 tablet (10 mg total) by mouth every 6 (six) hours as needed (pain). 03/22/24  Yes Xeng Kucher K, MD  Cholecalciferol (VITAMIN D  PO)  Take 1 tablet by mouth daily.    [provider]  Multiple Vitamin (MULTIVITAMIN WITH MINERALS) TABS tablet Take 1 tablet by mouth daily.    [provider]  Omega-3 Fatty Acids (FISH OIL PO) Take 1 tablet by mouth daily.    [provider]  Zinc Sulfate (ZINC 15 PO) Take by mouth.    [provider]    Family History Family History  Problem Relation Age of Onset   Diabetes Father    Healthy Sister    Diabetes Brother    Healthy Brother    Healthy Son    Healthy Son    Colon cancer Neg Hx    Esophageal cancer Neg Hx    Stomach cancer Neg Hx    Rectal cancer Neg Hx     Social History Social History   Tobacco Use   Smoking status: Never   Smokeless tobacco: Never  Vaping Use   Vaping status: Never Used  Substance Use Topics   Alcohol use: Yes    Alcohol/week: 1.0 standard drink of alcohol    Types: 1 Cans of beer per week    Comment: occasional   Drug use: Yes    Types: Marijuana    Comment: last used 38mo ago     Allergies   Patient has no known allergies.   Review of Systems Review of Systems  Physical Exam Triage Vital Signs ED Triage Vitals  Encounter Vitals Group     BP 03/22/24 1102 126/76     Girls Systolic BP Percentile --      Girls Diastolic BP Percentile --      Boys Systolic BP Percentile --      Boys Diastolic BP Percentile --      Pulse Rate 03/22/24 1102 74     Resp 03/22/24 1102 20     Temp 03/22/24 1102 98.6 F (37 C)     Temp Source 03/22/24 1102 Oral     SpO2 03/22/24 1102 97 %     Weight --      Height --      Head Circumference --      Peak Flow --      Pain Score 03/22/24 1103 4     Pain Loc --      Pain Education --      Exclude from Growth Chart --    No data found.  Updated Vital Signs BP 126/76 (BP Location: Right Arm)   Pulse 74   Temp 98.6 F (37 C) (Oral)   Resp 20   SpO2 97%   Visual Acuity Right Eye Distance:   Left Eye Distance:   Bilateral Distance:    Right Eye  Near:   Left Eye Near:    Bilateral Near:     Physical Exam Vitals reviewed.  Constitutional:      General: He is not in acute distress.    Appearance: He is not toxic-appearing.  HENT:     Right Ear: Tympanic membrane and ear canal normal.     Left Ear: Tympanic membrane and ear canal normal.     Nose: Nose normal.     Mouth/Throat:     Mouth: Mucous membranes are moist.     Comments: There is erythema of both tonsils and they are hypertrophied 3+.  There is white exudate in the tonsils.  There is no asymmetry and the uvula is in the midline.  No swelling of the soft palate.  Eyes:     Extraocular Movements: Extraocular movements intact.     Conjunctiva/sclera: Conjunctivae normal.     Pupils: Pupils are equal, round, and reactive to light.    Cardiovascular:     Rate and Rhythm: Normal rate and regular rhythm.     Heart sounds: No murmur heard. Pulmonary:     Effort: Pulmonary effort is normal. No respiratory distress.     Breath sounds: Normal breath sounds. No stridor. No wheezing, rhonchi or rales.   Musculoskeletal:     Cervical back: Neck supple.  Lymphadenopathy:     Cervical: No cervical adenopathy.   Skin:    Capillary Refill: Capillary refill takes less than 2 seconds.     Coloration: Skin is not jaundiced or pale.   Neurological:     General: No focal deficit present.     Mental Status: He is alert and oriented to person, place, and time.   Psychiatric:        Behavior: Behavior normal.      UC Treatments / Results  Labs (all labs ordered are listed, but only abnormal results are displayed) Labs Reviewed  POCT RAPID STREP A (OFFICE) - Normal  POCT MONO SCREEN ([facility]) - Normal  CULTURE, GROUP A STREP [facility])    EKG   Radiology No results found.  Procedures Procedures (including critical care time)  Medications Ordered in UC Medications  ketorolac  (TORADOL ) 30 MG/ML injection 30 mg (30 mg Intramuscular Given 03/22/24 1206)    Initial  Impression / Assessment and Plan / UC Course  I have reviewed the triage vital signs and the nursing notes.  Pertinent labs & imaging results that were available during my care of the patient were reviewed by me and considered in my medical decision making (see chart for details).     Rapid strep is negative  Monotest is negative  Throat culture is sent and staff will notify him if that is positive.  Toradol  was given to relieve his pain and Toradol  tablets are also sent to the pharmacy. Final Clinical Impressions(s) / UC Diagnoses   Final diagnoses:  Acute pharyngitis, unspecified etiology     Discharge Instructions      The monotest is negative  Your strep test is negative.  Culture of the throat will be sent, and staff will notify you if that is in turn positive.  Results will also go to your MyChart. This is most likely some other virus causing your symptoms.  You have been given a shot of Toradol  30 mg today.  Ketorolac  10 mg tablets--take 1 tablet every 6 hours as needed for pain.  This is the same medicine that is in the shot we just gave you        ED Prescriptions     Medication Sig Dispense Auth. Provider   ketorolac  (TORADOL ) 10 MG tablet Take 1 tablet (10 mg total) by mouth every 6 (six) hours as needed (pain). 20 tablet Kebra Lowrimore, Sharlet POUR, MD      I have reviewed the PDMP during this encounter.   Vonna Sharlet POUR, MD 03/22/24 [phone]

## 2024-03-23 ENCOUNTER — Emergency Department (HOSPITAL_BASED_OUTPATIENT_CLINIC_OR_DEPARTMENT_OTHER)

## 2024-03-23 ENCOUNTER — Emergency Department (HOSPITAL_BASED_OUTPATIENT_CLINIC_OR_DEPARTMENT_OTHER)
Admission: EM | Admit: 2024-03-23 | Discharge: 2024-03-23 | Disposition: A | Discharge status: Home / Self Care | Attending: Emergency Medicine | Admitting: Emergency Medicine

## 2024-03-23 ENCOUNTER — Encounter (HOSPITAL_BASED_OUTPATIENT_CLINIC_OR_DEPARTMENT_OTHER): Payer: Self-pay | Admitting: Emergency Medicine

## 2024-03-23 ENCOUNTER — Other Ambulatory Visit: Payer: Self-pay

## 2024-03-23 ENCOUNTER — Ambulatory Visit: Payer: Self-pay

## 2024-03-23 DIAGNOSIS — J029 Acute pharyngitis, unspecified: Secondary | ICD-10-CM | POA: Diagnosis not present

## 2024-03-23 DIAGNOSIS — J351 Hypertrophy of tonsils: Secondary | ICD-10-CM | POA: Diagnosis not present

## 2024-03-23 DIAGNOSIS — J039 Acute tonsillitis, unspecified: Secondary | ICD-10-CM | POA: Diagnosis not present

## 2024-03-23 DIAGNOSIS — D72829 Elevated white blood cell count, unspecified: Secondary | ICD-10-CM | POA: Insufficient documentation

## 2024-03-23 DIAGNOSIS — R590 Localized enlarged lymph nodes: Secondary | ICD-10-CM | POA: Diagnosis not present

## 2024-03-23 DIAGNOSIS — M4802 Spinal stenosis, cervical region: Secondary | ICD-10-CM | POA: Diagnosis not present

## 2024-03-23 LAB — COMPREHENSIVE METABOLIC PANEL WITH GFR
ALT: 21 U/L (ref 0–44)
AST: 29 U/L (ref 15–41)
Albumin: 4.5 g/dL (ref 3.5–5.0)
Alkaline Phosphatase: 88 U/L (ref 38–126)
Anion gap: 13 (ref 5–15)
BUN: 16 mg/dL (ref 8–23)
CO2: 24 mmol/L (ref 22–32)
Calcium: 10.3 mg/dL (ref 8.9–10.3)
Chloride: 102 mmol/L (ref 98–111)
Creatinine, Ser: 1.19 mg/dL (ref 0.61–1.24)
GFR, Estimated: >60 mL/min (ref >60)
Glucose, Bld: 117 mg/dL — ABNORMAL HIGH (ref 70–99)
Potassium: 4.4 mmol/L (ref 3.5–5.1)
Sodium: 140 mmol/L (ref 135–145)
Total Bilirubin: 1 mg/dL (ref 0.0–1.2)
Total Protein: 8.5 g/dL — ABNORMAL HIGH (ref 6.5–8.1)

## 2024-03-23 LAB — CBC WITH DIFFERENTIAL/PLATELET
Abs Immature Granulocytes: 0.06 10*3/uL (ref 0.00–0.07)
Basophils Absolute: 0.1 10*3/uL (ref 0.0–0.1)
Basophils Relative: 1 %
Eosinophils Absolute: 0.1 10*3/uL (ref 0.0–0.5)
Eosinophils Relative: 0 %
HCT: 39.9 % (ref 39.0–52.0)
Hemoglobin: 12.9 g/dL — ABNORMAL LOW (ref 13.0–17.0)
Immature Granulocytes: 0 %
Lymphocytes Relative: 14 %
Lymphs Abs: 2.2 10*3/uL (ref 0.7–4.0)
MCH: 27.2 pg (ref 26.0–34.0)
MCHC: 32.3 g/dL (ref 30.0–36.0)
MCV: 84 fL (ref 80.0–100.0)
Monocytes Absolute: 1.7 10*3/uL — ABNORMAL HIGH (ref 0.1–1.0)
Monocytes Relative: 11 %
Neutro Abs: 11.8 10*3/uL — ABNORMAL HIGH (ref 1.7–7.7)
Neutrophils Relative %: 74 %
Platelets: 242 10*3/uL (ref 150–400)
RBC: 4.75 MIL/uL (ref 4.22–5.81)
RDW: 13.2 % (ref 11.5–15.5)
WBC: 15.8 10*3/uL — ABNORMAL HIGH (ref 4.0–10.5)
nRBC: 0 % (ref 0.0–0.2)

## 2024-03-23 LAB — GROUP A STREP BY PCR: Group A Strep by PCR: NOT DETECTED

## 2024-03-23 MED ORDER — PREDNISONE 5 MG/5ML PO SOLN
20.0000 mg | Freq: Every day | ORAL | 0 refills | Status: AC
Start: 2024-03-23 — End: ?

## 2024-03-23 MED ORDER — IOHEXOL 300 MG/ML  SOLN
100.0000 mL | Freq: Once | INTRAMUSCULAR | Status: AC | PRN
  Administered 2024-03-23: 80 mL via INTRAVENOUS

## 2024-03-23 MED ORDER — KETOROLAC TROMETHAMINE 15 MG/ML IJ SOLN
15.0000 mg | Freq: Once | INTRAMUSCULAR | Status: AC
  Administered 2024-03-23: 15 mg via INTRAVENOUS
  Filled 2024-03-23: qty 1

## 2024-03-23 MED ORDER — SODIUM CHLORIDE 0.9 % IV BOLUS
1000.0000 mL | Freq: Once | INTRAVENOUS | Status: AC
  Administered 2024-03-23: 1000 mL via INTRAVENOUS

## 2024-03-23 MED ORDER — AMOXICILLIN-POT CLAVULANATE 400-57 MG/5ML PO SUSR: 875.0000 mg | Freq: Two times a day (BID) | ORAL | 0 refills | Status: AC

## 2024-03-23 MED ORDER — DEXAMETHASONE SODIUM PHOSPHATE 10 MG/ML IJ SOLN
10.0000 mg | Freq: Once | INTRAMUSCULAR | Status: AC
  Administered 2024-03-23: 10 mg via INTRAVENOUS
  Filled 2024-03-23: qty 1

## 2024-03-23 MED ORDER — LIDOCAINE VISCOUS HCL 2 % MT SOLN: 15.0000 mL | OROMUCOSAL | 0 refills | Status: AC | PRN

## 2024-03-23 MED ORDER — LIDOCAINE VISCOUS HCL 2 % MT SOLN
15.0000 mL | Freq: Once | OROMUCOSAL | Status: AC
  Administered 2024-03-23: 15 mL via OROMUCOSAL
  Filled 2024-03-23: qty 15

## 2024-03-23 NOTE — ED Notes (Signed)
 Discharge instructions, follow up care, and prescriptions reviewed and explained, pt verbalized understanding and had no further questions on d/c. Pt caox4, ambulatory, NAD on d/c.

## 2024-03-23 NOTE — Telephone Encounter (Signed)
 FYI Only or Action Required?: FYI only for provider.  Patient was last seen in primary care on 12/26/2023 by Chandra Toribio POUR, MD. Called Nurse Triage reporting Sore Throat. Symptoms began several days ago. Interventions attempted: Prescription medications: Toradol . Symptoms are: gradually worsening.  Triage Disposition: Go to ED Now (Notify PCP)-  Patient/caregiver understands and will follow disposition?: Yes  FYI: Patient was seen in UC yesterday, negative for strep and mono. Per wife, throat pain is worsening and patient is unable to swallow, now using a spit cup. Will go to [facility] ED at [facility]      Copied from CRM [id number]. Topic: Clinical - Red Word Triage >> Mar 23, 2024  9:32 AM Antwanette L wrote: Red Word that prompted transfer to Nurse Triage: patient is having severe throat pain. The patient can not swallow Reason for Disposition  [1] Drooling or spitting out saliva (because can't swallow) AND [2] normal breathing  Answer Assessment - Initial Assessment Questions 1. ONSET: When did the throat start hurting? (Hours or days ago)      Pain started 3 days ago  2. SEVERITY: How bad is the sore throat? (Scale 1-10; mild, moderate or severe)   - MILD (1-3):  Doesn't interfere with eating or normal activities.   - MODERATE (4-7): Interferes with eating some solids and normal activities.   - SEVERE (8-10):  Excruciating pain, interferes with most normal activities.   - SEVERE WITH DYSPHAGIA (10): Can't swallow liquids, drooling.     9/10 has trouble swallowing, currently using a spit cup. Pain increases when trying to swallow  3. STREP EXPOSURE: Has there been any exposure to strep within the past week? If Yes, ask: What type of contact occurred?      Unsure, but was seen at [facility] yesterday and tested negative for strep  4.  VIRAL SYMPTOMS: Are there any symptoms of a cold, such as a runny nose, cough, hoarse voice or red eyes?      Voice is muffled  5. FEVER: Do you have  a fever? If Yes, ask: What is your temperature, how was it measured, and when did it start?     No  6. PUS ON THE TONSILS: Is there pus on the tonsils in the back of your throat?     Unsure  7. OTHER SYMPTOMS: Do you have any other symptoms? (e.g., difficulty breathing, headache, rash)     no 8. PREGNANCY: Is there any chance you are pregnant? When was your last menstrual period?     No  Protocols used: Sore Throat-A-AH

## 2024-03-23 NOTE — ED Provider Notes (Signed)
 [place] EMERGENCY DEPARTMENT AT [facility] Provider Note   CSN: [id number] Arrival date & time: 03/23/24  1031     Patient presents with: Sore Throat   Wesley King is a 63 y.o. male with noncontributory past medical history presents with complaints of sore throat.  Symptoms have been ongoing for the past 3 days.  Was evaluated urgent care yesterday.  Strep test and monotest were negative at that time.  Denies any cough or fever.  No shortness of breath.  Does report difficulty swallowing.  States he has been unable to even tolerate fluids.     Sore Throat   Past Medical History:  Diagnosis Date   Hyperlipidemia    Testicular torsion        Prior to Admission medications   Medication Sig Start Date End Date Taking? Authorizing Provider  amoxicillin-clavulanate (AUGMENTIN) 400-57 MG/5ML suspension Take 10.9 mLs (875 mg total) by mouth 2 (two) times daily for 10 days. 03/23/24 04/02/24 Yes Donnajean Lynwood DEL, PA-C  lidocaine  (XYLOCAINE ) 2 % solution Use as directed 15 mLs in the mouth or throat as needed for mouth pain. 03/23/24  Yes Donnajean Lynwood DEL, PA-C  meloxicam  (MOBIC ) 15 MG tablet Take by mouth. 03/03/24  Yes [provider]  predniSONE 5 MG/5ML solution Take 20 mLs (20 mg total) by mouth daily with breakfast. 03/23/24  Yes Donnajean Lynwood DEL, PA-C  Cholecalciferol (VITAMIN D  PO) Take 1 tablet by mouth daily.    [provider]  ketorolac  (TORADOL ) 10 MG tablet Take 1 tablet (10 mg total) by mouth every 6 (six) hours as needed (pain). 03/22/24   Banister, Pamela K, MD  Multiple Vitamin (MULTIVITAMIN WITH MINERALS) TABS tablet Take 1 tablet by mouth daily.    [provider]  Omega-3 Fatty Acids (FISH OIL PO) Take 1 tablet by mouth daily.    [provider]  Zinc Sulfate (ZINC 15 PO) Take by mouth.    [provider]    Allergies: Patient has no known allergies.    Review of Systems  HENT:  Positive for sore throat.      Updated Vital Signs BP 134/79 (BP Location: Right Arm)   Pulse 69   Temp 98.9 F (37.2 C)   Resp 18   SpO2 95%   Physical Exam Vitals and nursing note reviewed.  Constitutional:      General: He is not in acute distress.    Appearance: He is well-developed.  HENT:     Head: Normocephalic and atraumatic.     Comments: Unable to view entire tonsils, superior portion with bilateral exudate, no submandibular swelling, no discomfort with tracheal deviation, no trismus or pooling of secretions, voice is notably muffled  Eyes:     Conjunctiva/sclera: Conjunctivae normal.    Cardiovascular:     Rate and Rhythm: Normal rate and regular rhythm.     Heart sounds: No murmur heard. Pulmonary:     Effort: Pulmonary effort is normal. No respiratory distress.     Breath sounds: Normal breath sounds.     Comments: No stridor Abdominal:     Palpations: Abdomen is soft.     Tenderness: There is no abdominal tenderness.   Musculoskeletal:        General: No swelling.     Cervical back: Neck supple.   Skin:    General: Skin is warm and dry.     Capillary Refill: Capillary refill takes less than 2 seconds.   Neurological:  Mental Status: He is alert.   Psychiatric:        Mood and Affect: Mood normal.     (all labs ordered are listed, but only abnormal results are displayed) Labs Reviewed  CBC WITH DIFFERENTIAL/PLATELET - Abnormal; Notable for the following components:      Result Value   WBC 15.8 (*)    Hemoglobin 12.9 (*)    Neutro Abs 11.8 (*)    Monocytes Absolute 1.7 (*)    All other components within normal limits  COMPREHENSIVE METABOLIC PANEL WITH GFR - Abnormal; Notable for the following components:   Glucose, Bld 117 (*)    Total Protein 8.5 (*)    All other components within normal limits  GROUP A STREP BY PCR    EKG: None  Radiology: CT Soft Tissue Neck W Contrast Result Date: 03/23/2024 CLINICAL DATA:  Soft tissue infection suspected. EXAM: CT NECK  WITH CONTRAST TECHNIQUE: Multidetector CT imaging of the neck was performed using the standard protocol following the bolus administration of intravenous contrast. RADIATION DOSE REDUCTION: This exam was performed according to the departmental dose-optimization program which includes automated exposure control, adjustment of the mA and/or kV according to patient size and/or use of iterative reconstruction technique. CONTRAST:  80mL OMNIPAQUE IOHEXOL 300 MG/ML  SOLN COMPARISON:  None Available. FINDINGS: Pharynx and larynx: There is symmetric enlargement of the bilateral palatine tonsils with mild enhancement. There is moderate narrowing of the oropharynx. No evidence of peritonsillar abscess on the current study. The nasopharynx is symmetric. Normal appearance of the palate. Oral cavity and floor of mouth run remarkable. There is mild prominence of the lingual tonsils. Epiglottis is unremarkable. No retropharyngeal effusion. Aryepiglottic folds and piriform sinuses are symmetric. Normal appearance of the vocal folds. Salivary glands: No inflammation, mass, or stone. Thyroid : Normal. Lymph nodes: Enlarged bilateral level 2 cervical lymph nodes measuring up to 1.3 cm in short axis. Additional subcentimeter level 1 and level 3 lymph nodes. Vascular: Negative. Limited intracranial: Limited visualization of intracranial structures without focal acute abnormality. Visualized orbits: The visualized orbits are unremarkable. Mastoids and visualized paranasal sinuses: Mild mucosal thickening in the ethmoid sinuses. Focal mucosal thickening in the alveolar recess of the left maxillary sinus. No air-fluid levels. Mastoid air cells are clear. Skeleton: No acute or aggressive process. Upper chest: Negative. Other: None. IMPRESSION: Symmetric enlargement and enhancement of the bilateral palatine tonsils suggestive of tonsillitis. Associated moderate narrowing of the oropharynx. No peritonsillar abscess visualized. Enlarged  bilateral level 2 cervical lymph nodes, likely reactive. Electronically Signed   By: Donnice Mania M.D.   On: 03/23/2024 14:41     Procedures   Medications Ordered in the ED  dexamethasone  (DECADRON ) injection 10 mg (10 mg Intravenous Given 03/23/24 1217)  sodium chloride  0.9 % bolus 1,000 mL (0 mLs Intravenous Stopped 03/23/24 1349)  lidocaine  (XYLOCAINE ) 2 % viscous mouth solution 15 mL (15 mLs Mouth/Throat Given 03/23/24 1222)  ketorolac  (TORADOL ) 15 MG/ML injection 15 mg (15 mg Intravenous Given 03/23/24 1221)  iohexol (OMNIPAQUE) 300 MG/ML solution 100 mL (80 mLs Intravenous Contrast Given 03/23/24 1237)                                    Medical Decision Making Amount and/or Complexity of Data Reviewed Labs: ordered. Radiology: ordered.  Risk Prescription drug management.   This patient presents to the ED with chief complaint(s) of sore throat.  The complaint  involves an extensive differential diagnosis and also carries with it a high risk of complications and morbidity.   Pertinent past medical history as listed in HPI  The differential diagnosis includes  Pharyngitis, Ludwig's angina, epiglottitis, retropharyngeal/peritonsillar abscess Additional history obtained: Records reviewed Care Everywhere/External Records  Assessment and management:   Hemodynamically stable, afebrile, nontoxic-appearing patient presenting with sore throat x 3 days.  Associated with difficulty swallowing.  No difficulty breathing.  Was evaluated urgent care yesterday, negative strep and monotest.  On exam patient does have notable muffled phonation, superior portion of tonsils with bilateral exudate, unable to appreciate peritonsillar abscess.  There is no trismus or pooling of secretions, no pain with tracheal deviation.  Do not suspect epiglottitis.  Given limited exam will obtain CT soft tissue neck, basic labs and repeat strep test.   Lab work notable for leukocytosis of 15.8, CT scan is suggestive  of tonsillitis.  Given the severity of his symptoms we will send in course of Augmentin, viscous lidocaine  and prednisone.  Discussed this with patient.  He is understanding and in agreement.  Independent ECG interpretation:  none  Independent labs interpretation:  The following labs were independently interpreted:  CBC with leukocytosis of 15.8, CMP without significant abnormality, strep test negative  Independent visualization and interpretation of imaging: I independently visualized the following imaging with scope of interpretation limited to determining acute life threatening conditions related to emergency care:  CT soft tissue neck with symmetric enhancement enlargement of bilateral tonsils, suggestive of tonsillitis with associated oropharynx narrowing and enlargement of 2 cervical lymph nodes   Consultations obtained:   none  Disposition:   Patient will be discharged home. The patient has been appropriately medically screened and/or stabilized in the ED. I have low suspicion for any other emergent medical condition which would require further screening, evaluation or treatment in the ED or require inpatient management. At time of discharge the patient is hemodynamically stable and in no acute distress. I have discussed work-up results and diagnosis with patient and answered all questions. Patient is agreeable with discharge plan. We discussed strict return precautions for returning to the emergency department and they verbalized understanding.  '  Social Determinants of Health:   none  This note was dictated with voice recognition software.  Despite best efforts at proofreading, errors may have occurred which can change the documentation meaning.       Final diagnoses:  Tonsillitis    ED Discharge Orders          Ordered    amoxicillin-clavulanate (AUGMENTIN) 400-57 MG/5ML suspension  2 times daily        03/23/24 1513    lidocaine  (XYLOCAINE ) 2 % solution  As needed         03/23/24 1513    predniSONE 5 MG/5ML solution  Daily with breakfast        03/23/24 1513               Donnajean Lynwood VEAR DEVONNA 03/23/24 1514    Doretha Folks, MD 03/23/24 1521

## 2024-03-23 NOTE — ED Triage Notes (Signed)
 Pt caox4, ambulatory c/o sore throat and swelling x3 days. Pt had neg strep swab at UC yesterday. Pt denies SOB, N/V/D, cough, congestion, fever.

## 2024-03-23 NOTE — Discharge Instructions (Addendum)
 You were evaluated in the emergency room for sore throat.  Your CT scan was consistent with tonsillitis.  A prescription for Augmentin, antibiotic was sent into your pharmacy.  You additionally provided a prescription for steroids and lidocaine  solution to help with pain and inflammation.

## 2024-03-25 LAB — CULTURE, GROUP A STREP (THRC)

## 2024-03-31 DIAGNOSIS — S83241D Other tear of medial meniscus, current injury, right knee, subsequent encounter: Secondary | ICD-10-CM | POA: Diagnosis not present

## 2024-04-06 ENCOUNTER — Encounter: Payer: Self-pay | Admitting: Family Medicine

## 2024-04-06 ENCOUNTER — Ambulatory Visit (INDEPENDENT_AMBULATORY_CARE_PROVIDER_SITE_OTHER): Admitting: Family Medicine

## 2024-04-06 ENCOUNTER — Ambulatory Visit: Payer: Self-pay | Admitting: Family Medicine

## 2024-04-06 VITALS — BP 135/84 | HR 74 | Ht 73.0 in | Wt 266.0 lb

## 2024-04-06 DIAGNOSIS — J039 Acute tonsillitis, unspecified: Secondary | ICD-10-CM

## 2024-04-06 DIAGNOSIS — R82998 Other abnormal findings in urine: Secondary | ICD-10-CM | POA: Diagnosis not present

## 2024-04-06 LAB — POCT URINALYSIS DIP (CLINITEK)
Bilirubin, UA: NEGATIVE
Glucose, UA: NEGATIVE mg/dL
Ketones, POC UA: NEGATIVE mg/dL
Leukocytes, UA: NEGATIVE
Nitrite, UA: NEGATIVE
POC PROTEIN,UA: NEGATIVE
Spec Grav, UA: 1.025 (ref 1.010–1.025)
Urobilinogen, UA: 0.2 U/dL
pH, UA: 5.5 (ref 5.0–8.0)

## 2024-04-06 NOTE — Patient Instructions (Signed)
 It was nice to see you today,  We addressed the following topics today: -If you notice that your swelling or difficulty swallowing worsens please let us  know.  If he gets to the point where you cannot swallow your own saliva then you should go to the emergency room again. - I am checking your urine for protein.  If it is positive I will send it in for microscopy.  If the microscopy is positive I will refer you to a nephrologist.  Have a great day,  Rolan Slain, MD

## 2024-04-06 NOTE — Assessment & Plan Note (Signed)
 Presented to the ED with severe throat swelling and odynophagia. Was treated with Augmentin  and prednisone  with resolution of acute symptoms. Strep and mono tests were negative in the ED. Current exam is unremarkable. - Reassured that residual swelling can take time to resolve completely. - Counseled on signs of worsening infection, such as difficulty breathing or inability to swallow saliva, and to return to the ED if these occur. - Check WBC to ensure resolution of leukocytosis. Will message with results. - Hemoglobin was borderline low on ED labs (12.9), will recheck with CBC.

## 2024-04-06 NOTE — Progress Notes (Signed)
   Established Patient Office Visit  Subjective   Patient ID: Wesley King, male    DOB: [birth year]  Age: 63 y.o. MRN: [id number]  Chief Complaint  Patient presents with   Hospitalization Follow-up    HPI  Subjective - Follow-up from an emergency room visit for a severe throat infection. Reports significant swelling, to the point of being unable to swallow saliva. - Symptoms improved after finishing a 10-day course of Augmentin , which was completed around 03/31/2024 or 04/01/2024. - Denies worsening of symptoms after stopping antibiotics. Can now swallow and eat food without issue. - Reports a sensation of fullness in the throat when eating, for example, a burger. Denies choking or issues with talking. - Expressed concern about bubbles in urine. Unsure if this is a new symptom but has been noticing it. Denies other urinary symptoms. - Inquires about tonsils and their role in the infection. - Reports ongoing healing from a recent knee arthroscopic surgery. States it is healing up pretty decent.  Medications: Augmentin  10-day course completed, prednisone  taken during ED visit. Takes protein powder.  PMH, PSH, FH, Social Hx: PMHx: Recent severe throat infection/tonsillitis. PSHx: Knee arthroscopic surgery. Social Hx: Reports trying to eat adequate protein, including chicken, fish, eggs, and protein powder.  ROS: Constitutional: Denies fever. HEENT: Denies odynophagia currently. Reports sensation of throat fullness with eating. CV: Denies chest pain. RESP: Denies shortness of breath or trouble breathing. GI: Reports ability to eat and swallow. GU: Reports bubbles in urine. MSK: Reports healing from recent knee surgery.   The 10-year ASCVD risk score (Arnett DK, et al., 2019) is: 9.6%  Health Maintenance Due  Topic Date Due   Zoster Vaccines- Shingrix (2 of 2) 01/21/2022   COVID-19 Vaccine (4 - 2024-25 season) 06/01/2023      Objective:     BP 135/84   Pulse 74   Ht 6' 1  (1.854 m)   Wt 266 lb (120.7 kg)   SpO2 98%   BMI 35.09 kg/m    Physical Exam General: No acute distress. HEENT: No external swelling noted on inspection. Oral pharynx appears non-erythematous. PULM: Clear to auscultation bilaterally.       Assessment & Plan:   Tonsillitis Assessment & Plan: Presented to the ED with severe throat swelling and odynophagia. Was treated with Augmentin  and prednisone  with resolution of acute symptoms. Strep and mono tests were negative in the ED. Current exam is unremarkable. - Reassured that residual swelling can take time to resolve completely. - Counseled on signs of worsening infection, such as difficulty breathing or inability to swallow saliva, and to return to the ED if these occur. - Check WBC to ensure resolution of leukocytosis. Will message with results. - Hemoglobin was borderline low on ED labs (12.9), will recheck with CBC.   Foamy urine Assessment & Plan:  Reports bubbles in urine, concerned about kidney function. Blood tests from ED visit showed normal kidney function. - Counseled that proteinuria can occur with normal kidney function and is not related to dietary protein intake. - Urinalysis ordered to check for protein. - If UA is positive, will send for urine microscopy. If that is positive, will refer to nephrology. - Patient will be notified of results via message.  Orders: -     POCT URINALYSIS DIP (CLINITEK)     Return if symptoms worsen or fail to improve.    Toribio MARLA Slain, MD

## 2024-04-06 NOTE — Assessment & Plan Note (Signed)
 Reports bubbles in urine, concerned about kidney function. Blood tests from ED visit showed normal kidney function. - Counseled that proteinuria can occur with normal kidney function and is not related to dietary protein intake. - Urinalysis ordered to check for protein. - If UA is positive, will send for urine microscopy. If that is positive, will refer to nephrology. - Patient will be notified of results via message.

## 2024-04-15 DIAGNOSIS — S83231D Complex tear of medial meniscus, current injury, right knee, subsequent encounter: Secondary | ICD-10-CM | POA: Diagnosis not present

## 2024-04-15 DIAGNOSIS — M6281 Muscle weakness (generalized): Secondary | ICD-10-CM | POA: Diagnosis not present

## 2024-04-15 DIAGNOSIS — M25661 Stiffness of right knee, not elsewhere classified: Secondary | ICD-10-CM | POA: Diagnosis not present

## 2024-04-20 DIAGNOSIS — M6281 Muscle weakness (generalized): Secondary | ICD-10-CM | POA: Diagnosis not present

## 2024-04-20 DIAGNOSIS — S83231D Complex tear of medial meniscus, current injury, right knee, subsequent encounter: Secondary | ICD-10-CM | POA: Diagnosis not present

## 2024-04-20 DIAGNOSIS — M25661 Stiffness of right knee, not elsewhere classified: Secondary | ICD-10-CM | POA: Diagnosis not present

## 2024-04-26 DIAGNOSIS — S83231D Complex tear of medial meniscus, current injury, right knee, subsequent encounter: Secondary | ICD-10-CM | POA: Diagnosis not present

## 2024-04-26 DIAGNOSIS — M6281 Muscle weakness (generalized): Secondary | ICD-10-CM | POA: Diagnosis not present

## 2024-04-26 DIAGNOSIS — M25661 Stiffness of right knee, not elsewhere classified: Secondary | ICD-10-CM | POA: Diagnosis not present

## 2024-05-05 DIAGNOSIS — M6281 Muscle weakness (generalized): Secondary | ICD-10-CM | POA: Diagnosis not present

## 2024-05-05 DIAGNOSIS — M25661 Stiffness of right knee, not elsewhere classified: Secondary | ICD-10-CM | POA: Diagnosis not present

## 2024-05-05 DIAGNOSIS — S83231D Complex tear of medial meniscus, current injury, right knee, subsequent encounter: Secondary | ICD-10-CM | POA: Diagnosis not present

## 2024-05-14 DIAGNOSIS — M6281 Muscle weakness (generalized): Secondary | ICD-10-CM | POA: Diagnosis not present

## 2024-05-14 DIAGNOSIS — M25661 Stiffness of right knee, not elsewhere classified: Secondary | ICD-10-CM | POA: Diagnosis not present

## 2024-05-14 DIAGNOSIS — S83231D Complex tear of medial meniscus, current injury, right knee, subsequent encounter: Secondary | ICD-10-CM | POA: Diagnosis not present

## 2024-05-26 DIAGNOSIS — M6281 Muscle weakness (generalized): Secondary | ICD-10-CM | POA: Diagnosis not present

## 2024-05-26 DIAGNOSIS — M25661 Stiffness of right knee, not elsewhere classified: Secondary | ICD-10-CM | POA: Diagnosis not present

## 2024-05-26 DIAGNOSIS — S83231D Complex tear of medial meniscus, current injury, right knee, subsequent encounter: Secondary | ICD-10-CM | POA: Diagnosis not present

## 2024-06-04 DIAGNOSIS — S83231D Complex tear of medial meniscus, current injury, right knee, subsequent encounter: Secondary | ICD-10-CM | POA: Diagnosis not present

## 2024-06-04 DIAGNOSIS — M722 Plantar fascial fibromatosis: Secondary | ICD-10-CM | POA: Diagnosis not present

## 2024-07-02 DIAGNOSIS — M722 Plantar fascial fibromatosis: Secondary | ICD-10-CM | POA: Diagnosis not present

## 2024-07-02 DIAGNOSIS — S83231D Complex tear of medial meniscus, current injury, right knee, subsequent encounter: Secondary | ICD-10-CM | POA: Diagnosis not present

## 2024-08-05 DIAGNOSIS — M25661 Stiffness of right knee, not elsewhere classified: Secondary | ICD-10-CM | POA: Diagnosis not present

## 2024-08-05 DIAGNOSIS — M25561 Pain in right knee: Secondary | ICD-10-CM | POA: Diagnosis not present

## 2024-08-05 DIAGNOSIS — R262 Difficulty in walking, not elsewhere classified: Secondary | ICD-10-CM | POA: Diagnosis not present

## 2024-08-05 DIAGNOSIS — M1711 Unilateral primary osteoarthritis, right knee: Secondary | ICD-10-CM | POA: Diagnosis not present

## 2024-08-24 DIAGNOSIS — M1711 Unilateral primary osteoarthritis, right knee: Secondary | ICD-10-CM | POA: Diagnosis not present

## 2024-08-24 DIAGNOSIS — M25561 Pain in right knee: Secondary | ICD-10-CM | POA: Diagnosis not present

## 2024-08-24 DIAGNOSIS — R262 Difficulty in walking, not elsewhere classified: Secondary | ICD-10-CM | POA: Diagnosis not present

## 2024-08-24 DIAGNOSIS — M25661 Stiffness of right knee, not elsewhere classified: Secondary | ICD-10-CM | POA: Diagnosis not present

## 2024-12-20 ENCOUNTER — Other Ambulatory Visit

## 2024-12-27 ENCOUNTER — Encounter: Admitting: Family Medicine
